# Patient Record
Sex: Male | Born: 1955 | Race: Black or African American | Hispanic: No | Marital: Married | Smoking: Former smoker
Health system: Southern US, Community
[De-identification: ages and names within clinical notes are randomized; demographics above are authoritative.]

## PROBLEM LIST (undated history)

## (undated) DIAGNOSIS — I10 Essential (primary) hypertension: Secondary | ICD-10-CM

## (undated) DIAGNOSIS — J45909 Unspecified asthma, uncomplicated: Secondary | ICD-10-CM

## (undated) DIAGNOSIS — M1990 Unspecified osteoarthritis, unspecified site: Secondary | ICD-10-CM

## (undated) DIAGNOSIS — E785 Hyperlipidemia, unspecified: Secondary | ICD-10-CM

## (undated) DIAGNOSIS — K219 Gastro-esophageal reflux disease without esophagitis: Secondary | ICD-10-CM

## (undated) HISTORY — PX: COLONOSCOPY: SHX174

---

## 2005-12-21 ENCOUNTER — Emergency Department (HOSPITAL_COMMUNITY): Admission: EM | Admit: 2005-12-21 | Discharge: 2005-12-21 | Payer: Self-pay | Admitting: Family Medicine

## 2006-11-12 ENCOUNTER — Emergency Department (HOSPITAL_COMMUNITY): Admission: EM | Admit: 2006-11-12 | Discharge: 2006-11-12 | Payer: Self-pay | Admitting: Emergency Medicine

## 2006-12-19 ENCOUNTER — Emergency Department (HOSPITAL_COMMUNITY): Admission: EM | Admit: 2006-12-19 | Discharge: 2006-12-20 | Payer: Self-pay | Admitting: Emergency Medicine

## 2008-08-24 ENCOUNTER — Emergency Department: Payer: Self-pay | Admitting: Emergency Medicine

## 2009-05-09 ENCOUNTER — Emergency Department (HOSPITAL_COMMUNITY): Admission: EM | Admit: 2009-05-09 | Discharge: 2009-05-09 | Payer: Self-pay | Admitting: Emergency Medicine

## 2009-08-18 ENCOUNTER — Emergency Department: Payer: Self-pay | Admitting: Emergency Medicine

## 2010-04-28 LAB — POCT URINALYSIS DIP (DEVICE)
Bilirubin Urine: NEGATIVE
Glucose, UA: NEGATIVE mg/dL
Hgb urine dipstick: NEGATIVE
Protein, ur: NEGATIVE mg/dL
Specific Gravity, Urine: 1.01 (ref 1.005–1.030)
Urobilinogen, UA: 0.2 mg/dL (ref 0.0–1.0)

## 2010-11-16 LAB — ETHANOL: Alcohol, Ethyl (B): 5

## 2010-11-18 LAB — I-STAT 8, (EC8 V) (CONVERTED LAB)
Acid-Base Excess: 3 — ABNORMAL HIGH
BUN: 14
Chloride: 105
Hemoglobin: 17.7 — ABNORMAL HIGH
Potassium: 4.4
Sodium: 142
TCO2: 32
pCO2, Ven: 54.5 — ABNORMAL HIGH

## 2010-11-18 LAB — POCT URINALYSIS DIP (DEVICE)
Bilirubin Urine: NEGATIVE
Nitrite: NEGATIVE
Protein, ur: NEGATIVE
Specific Gravity, Urine: 1.015

## 2010-11-18 LAB — POCT I-STAT CREATININE
Creatinine, Ser: 1.3
Operator id: 235561

## 2011-09-07 DIAGNOSIS — K219 Gastro-esophageal reflux disease without esophagitis: Secondary | ICD-10-CM | POA: Insufficient documentation

## 2014-12-29 ENCOUNTER — Emergency Department: Payer: No Typology Code available for payment source

## 2014-12-29 ENCOUNTER — Emergency Department
Admission: EM | Admit: 2014-12-29 | Discharge: 2014-12-29 | Disposition: A | Discharge status: Home / Self Care | Payer: No Typology Code available for payment source | Attending: Emergency Medicine | Admitting: Emergency Medicine

## 2014-12-29 DIAGNOSIS — Y9389 Activity, other specified: Secondary | ICD-10-CM | POA: Insufficient documentation

## 2014-12-29 DIAGNOSIS — S161XXA Strain of muscle, fascia and tendon at neck level, initial encounter: Secondary | ICD-10-CM | POA: Diagnosis not present

## 2014-12-29 DIAGNOSIS — Y998 Other external cause status: Secondary | ICD-10-CM | POA: Diagnosis not present

## 2014-12-29 DIAGNOSIS — S199XXA Unspecified injury of neck, initial encounter: Secondary | ICD-10-CM | POA: Diagnosis present

## 2014-12-29 DIAGNOSIS — Y92410 Unspecified street and highway as the place of occurrence of the external cause: Secondary | ICD-10-CM | POA: Insufficient documentation

## 2014-12-29 DIAGNOSIS — S39012A Strain of muscle, fascia and tendon of lower back, initial encounter: Secondary | ICD-10-CM | POA: Diagnosis not present

## 2014-12-29 MED ORDER — IBUPROFEN 800 MG PO TABS: 800.0000 mg | ORAL_TABLET | Freq: Three times a day (TID) | ORAL | Status: DC | PRN

## 2014-12-29 MED ORDER — CYCLOBENZAPRINE HCL 10 MG PO TABS: 10.0000 mg | ORAL_TABLET | Freq: Three times a day (TID) | ORAL | Status: DC | PRN
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
## 2014-12-29 NOTE — Discharge Instructions (Signed)
Cervical Sprain °A cervical sprain is an injury in the neck in which the strong, fibrous tissues (ligaments) that connect your neck bones stretch or tear. Cervical sprains can range from mild to severe. Severe cervical sprains can cause the neck vertebrae to be unstable. This can lead to damage of the spinal cord and can result in serious nervous system problems. The amount of time it takes for a cervical sprain to get better depends on the cause and extent of the injury. Most cervical sprains heal in 1 to 3 weeks. °CAUSES  °Severe cervical sprains may be caused by:  °· Contact sport injuries (such as from football, rugby, wrestling, hockey, auto racing, gymnastics, diving, martial arts, or boxing).   °· Motor vehicle collisions.   °· Whiplash injuries. This is an injury from a sudden forward and backward whipping movement of the head and neck.  °· Falls.   °Mild cervical sprains may be caused by:  °· Being in an awkward position, such as while cradling a telephone between your ear and shoulder.   °· Sitting in a chair that does not offer proper support.   °· Working at a poorly designed computer station.   °· Looking up or down for long periods of time.   °SYMPTOMS  °· Pain, soreness, stiffness, or a burning sensation in the front, back, or sides of the neck. This discomfort may develop immediately after the injury or slowly, 24 hours or more after the injury.   °· Pain or tenderness directly in the middle of the back of the neck.   °· Shoulder or upper back pain.   °· Limited ability to move the neck.   °· Headache.   °· Dizziness.   °· Weakness, numbness, or tingling in the hands or arms.   °· Muscle spasms.   °· Difficulty swallowing or chewing.   °· Tenderness and swelling of the neck.   °DIAGNOSIS  °Most of the time your health care provider can diagnose a cervical sprain by taking your history and doing a physical exam. Your health care provider will ask about previous neck injuries and any known neck  problems, such as arthritis in the neck. X-rays may be taken to find out if there are any other problems, such as with the bones of the neck. Other tests, such as a CT scan or MRI, may also be needed.  °TREATMENT  °Treatment depends on the severity of the cervical sprain. Mild sprains can be treated with rest, keeping the neck in place (immobilization), and pain medicines. Severe cervical sprains are immediately immobilized. Further treatment is done to help with pain, muscle spasms, and other symptoms and may include: °· Medicines, such as pain relievers, numbing medicines, or muscle relaxants.   °· Physical therapy. This may involve stretching exercises, strengthening exercises, and posture training. Exercises and improved posture can help stabilize the neck, strengthen muscles, and help stop symptoms from returning.   °HOME CARE INSTRUCTIONS  °· Put ice on the injured area.   °· Put ice in a plastic bag.   °· Place a towel between your skin and the bag.   °· Leave the ice on for 15-20 minutes, 3-4 times a day.   °· If your injury was severe, you may have been given a cervical collar to wear. A cervical collar is a two-piece collar designed to keep your neck from moving while it heals. °· Do not remove the collar unless instructed by your health care provider. °· If you have long hair, keep it outside of the collar. °· Ask your health care provider before making any adjustments to your collar. Minor   adjustments may be required over time to improve comfort and reduce pressure on your chin or on the back of your head. °· If you are allowed to remove the collar for cleaning or bathing, follow your health care provider's instructions on how to do so safely. °· Keep your collar clean by wiping it with mild soap and water and drying it completely. If the collar you have been given includes removable pads, remove them every 1-2 days and hand wash them with soap and water. Allow them to air dry. They should be completely  dry before you wear them in the collar. °· If you are allowed to remove the collar for cleaning and bathing, wash and dry the skin of your neck. Check your skin for irritation or sores. If you see any, tell your health care provider. °· Do not drive while wearing the collar.   °· Only take over-the-counter or prescription medicines for pain, discomfort, or fever as directed by your health care provider.   °· Keep all follow-up appointments as directed by your health care provider.   °· Keep all physical therapy appointments as directed by your health care provider.   °· Make any needed adjustments to your workstation to promote good posture.   °· Avoid positions and activities that make your symptoms worse.   °· Warm up and stretch before being active to help prevent problems.   °SEEK MEDICAL CARE IF:  °· Your pain is not controlled with medicine.   °· You are unable to decrease your pain medicine over time as planned.   °· Your activity level is not improving as expected.   °SEEK IMMEDIATE MEDICAL CARE IF:  °· You develop any bleeding. °· You develop stomach upset. °· You have signs of an allergic reaction to your medicine.   °· Your symptoms get worse.   °· You develop new, unexplained symptoms.   °· You have numbness, tingling, weakness, or paralysis in any part of your body.   °MAKE SURE YOU:  °· Understand these instructions. °· Will watch your condition. °· Will get help right away if you are not doing well or get worse. °  °This information is not intended to replace advice given to you by your health care provider. Make sure you discuss any questions you have with your health care provider. °  °Document Released: 11/21/2006 Document Revised: 01/29/2013 Document Reviewed: 08/01/2012 °Elsevier Interactive Patient Education ©2016 [employer] ° °Lumbosacral Strain °Lumbosacral strain is a strain of any of the parts that make up your lumbosacral vertebrae. Your lumbosacral vertebrae are the bones that make up  the lower third of your backbone. Your lumbosacral vertebrae are held together by muscles and tough, fibrous tissue (ligaments).  °CAUSES  °A sudden blow to your back can cause lumbosacral strain. Also, anything that causes an excessive stretch of the muscles in the low back can cause this strain. This is typically seen when people exert themselves strenuously, fall, lift heavy objects, bend, or crouch repeatedly. °RISK FACTORS °· Physically demanding work. °· Participation in pushing or pulling sports or sports that require a sudden twist of the back (tennis, golf, baseball). °· Weight lifting. °· Excessive lower back curvature. °· Forward-tilted pelvis. °· Weak back or abdominal muscles or both. °· Tight hamstrings. °SIGNS AND SYMPTOMS  °Lumbosacral strain may cause pain in the area of your injury or pain that moves (radiates) down your leg.  °DIAGNOSIS °Your health care provider can often diagnose lumbosacral strain through a physical exam. In some cases, you may need tests such as X-ray exams.  °TREATMENT  °  Treatment for your lower back injury depends on many factors that your clinician will have to evaluate. However, most treatment will include the use of anti-inflammatory medicines. °HOME CARE INSTRUCTIONS  °· Avoid hard physical activities (tennis, racquetball, waterskiing) if you are not in proper physical condition for it. This may aggravate or create problems. °· If you have a back problem, avoid sports requiring sudden body movements. Swimming and walking are generally safer activities. °· Maintain good posture. °· Maintain a healthy weight. °· For acute conditions, you may put ice on the injured area. °· Put ice in a plastic bag. °· Place a towel between your skin and the bag. °· Leave the ice on for 20 minutes, 2-3 times a day. °· When the low back starts healing, stretching and strengthening exercises may be recommended. °SEEK MEDICAL CARE IF: °· Your back pain is getting worse. °· You experience  severe back pain not relieved with medicines. °SEEK IMMEDIATE MEDICAL CARE IF:  °· You have numbness, tingling, weakness, or problems with the use of your arms or legs. °· There is a change in bowel or bladder control. °· You have increasing pain in any area of the body, including your belly (abdomen). °· You notice shortness of breath, dizziness, or feel faint. °· You feel sick to your stomach (nauseous), are throwing up (vomiting), or become sweaty. °· You notice discoloration of your toes or legs, or your feet get very cold. °MAKE SURE YOU:  °· Understand these instructions. °· Will watch your condition. °· Will get help right away if you are not doing well or get worse. °  °This information is not intended to replace advice given to you by your health care provider. Make sure you discuss any questions you have with your health care provider. °  °Document Released: 11/03/2004 Document Revised: 02/14/2014 Document Reviewed: 09/12/2012 °Elsevier Interactive Patient Education ©2016 [employer] Inc[employer] ° °Motor Vehicle Collision °It is common to have multiple bruises and sore muscles after a motor vehicle collision (MVC). These tend to feel worse for the first 24 hours. You may have the most stiffness and soreness over the first several hours. You may also feel worse when you wake up the first morning after your collision. After this point, you will usually begin to improve with each day. The speed of improvement often depends on the severity of the collision, the number of injuries, and the location and nature of these injuries. °HOME CARE INSTRUCTIONS °· Put ice on the injured area. °¨ Put ice in a plastic bag. °¨ Place a towel between your skin and the bag. °¨ Leave the ice on for 15-20 minutes, 3-4 times a day, or as directed by your health care provider. °· Drink enough fluids to keep your urine clear or pale yellow. Do not drink alcohol. °· Take a warm shower or bath once or twice a day. This will increase blood flow  to sore muscles. °· You may return to activities as directed by your caregiver. Be careful when lifting, as this may aggravate neck or back pain. °· Only take over-the-counter or prescription medicines for pain, discomfort, or fever as directed by your caregiver. Do not use aspirin. This may increase bruising and bleeding. °SEEK IMMEDIATE MEDICAL CARE IF: °· You have numbness, tingling, or weakness in the arms or legs. °· You develop severe headaches not relieved with medicine. °· You have severe neck pain, especially tenderness in the middle of the back of your neck. °· You have changes   in bowel or bladder control. °· There is increasing pain in any area of the body. °· You have shortness of breath, light-headedness, dizziness, or fainting. °· You have chest pain. °· You feel sick to your stomach (nauseous), throw up (vomit), or sweat. °· You have increasing abdominal discomfort. °· There is blood in your urine, stool, or vomit. °· You have pain in your shoulder (shoulder strap areas). °· You feel your symptoms are getting worse. °MAKE SURE YOU: °· Understand these instructions. °· Will watch your condition. °· Will get help right away if you are not doing well or get worse. °  °This information is not intended to replace advice given to you by your health care provider. Make sure you discuss any questions you have with your health care provider. °  °Document Released: 01/24/2005 Document Revised: 02/14/2014 Document Reviewed: 06/23/2010 °Elsevier Interactive Patient Education ©2016 [employer]vier Inc. ° °
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
## 2016-02-18 ENCOUNTER — Emergency Department: Payer: BLUE CROSS/BLUE SHIELD

## 2016-02-18 ENCOUNTER — Emergency Department
Admission: EM | Admit: 2016-02-18 | Discharge: 2016-02-18 | Disposition: A | Discharge status: Home / Self Care | Payer: BLUE CROSS/BLUE SHIELD | Attending: Emergency Medicine | Admitting: Emergency Medicine

## 2016-02-18 ENCOUNTER — Encounter: Payer: Self-pay | Admitting: Emergency Medicine

## 2016-02-18 DIAGNOSIS — F17200 Nicotine dependence, unspecified, uncomplicated: Secondary | ICD-10-CM | POA: Diagnosis not present

## 2016-02-18 DIAGNOSIS — I10 Essential (primary) hypertension: Secondary | ICD-10-CM | POA: Insufficient documentation

## 2016-02-18 DIAGNOSIS — M79642 Pain in left hand: Secondary | ICD-10-CM | POA: Insufficient documentation

## 2016-02-18 HISTORY — DX: Essential (primary) hypertension: I10

## 2016-02-18 MED ORDER — CELECOXIB 100 MG PO CAPS: 100.0000 mg | ORAL_CAPSULE | Freq: Every day | ORAL | 0 refills | Status: AC

## 2016-02-18 MED ORDER — CELECOXIB 100 MG PO CAPS: 100.0000 mg | ORAL_CAPSULE | Freq: Every day | ORAL | 2 refills | Status: DC

## 2016-02-18 MED ORDER — DICLOFENAC SODIUM 1 % TD CREA: 1.0000 g | TOPICAL_CREAM | Freq: Two times a day (BID) | TRANSDERMAL | 0 refills | Status: DC

## 2016-02-18 NOTE — ED Triage Notes (Signed)
States he developed left hand/wrist pin since Oct   W/o injury  Tender to touch  No swelling

## 2016-02-18 NOTE — ED Provider Notes (Signed)
[facility] Emergency Department Provider Note  ____________________________________________  Time seen: Approximately 10:35 AM  I have reviewed the triage vital signs and the nursing notes.   HISTORY  Chief Complaint Hand Pain    HPI Brian Forbes is a 61 y.o. male presents to the emergency department with left hand pain since October. Patient denies any injury to hand. Patient states that he has seen his PCP about hand and was told that he has arthritis. Patient states that it is difficult for him to make a fist. 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 Patient states that sometimes his hands will tingle in the night. Patient has taken meloxicam for pain, which has not helped.   Past Medical History:  Diagnosis Date  . Hypertension     There are no active problems to display for this patient.   History reviewed. No pertinent surgical history.  Prior to Admission medications   Medication Sig Start Date End Date Taking? Authorizing Provider  amLODipine-benazepril (LOTREL) 5-10 MG capsule Take 1 capsule by mouth daily.   Yes Historical Provider, MD  omeprazole (PRILOSEC) 40 MG capsule Take 40 mg by mouth daily.   Yes Historical Provider, MD  celecoxib (CELEBREX) 100 MG capsule Take 1 capsule (100 mg total) by mouth daily. 02/18/16 02/17/17  Enid DerryAshley Loriene Taunton, PA-C  Diclofenac Sodium 1 % CREA Place 1 g onto the skin 2 (two) times daily. 02/18/16   Enid DerryAshley Koula Venier, PA-C    Allergies Patient has no known allergies.  No family history on file.  Social History Social History  Substance Use Topics  . Smoking status: Current Every Day Smoker  . Smokeless tobacco: Never Used  . Alcohol use No     Review of Systems  Constitutional: No fever/chills ENT: No upper respiratory complaints. Cardiovascular: No chest pain. Respiratory: No SOB. Gastrointestinal: No abdominal pain.  No nausea, no vomiting.   Skin: Negative for rash, abrasions, lacerations, ecchymosis.    ____________________________________________   PHYSICAL EXAM:  VITAL SIGNS: ED Triage Vitals  Enc Vitals Group     BP 02/18/16 0927 (!) 158/82     Pulse Rate 02/18/16 0927 81     Resp 02/18/16 0927 18     Temp 02/18/16 0927 98.3 F (36.8 C)     Temp Source 02/18/16 0927 Oral     SpO2 02/18/16 0927 98 %     Weight 02/18/16 0928 169 lb (76.7 kg)     Height 02/18/16 0928 5\' 4"  (1.626 m)     Head Circumference --      Peak Flow --      Pain Score 02/18/16 0942 8     Pain Loc --      Pain Edu? --      Excl. in GC? --      Constitutional: Alert and oriented. Well appearing and in no acute distress. Eyes: Conjunctivae are normal. PERRL. EOMI. Head: Atraumatic. ENT:      Ears:      Nose: No congestion/rhinnorhea.      Mouth/Throat: Mucous membranes are moist.  Neck: No stridor.   Cardiovascular: Normal rate, regular rhythm. Normal S1 and S2.  Good peripheral circulation. 2+ radial pulses. Respiratory: Normal respiratory effort without tachypnea or retractions. Lungs CTAB. Good air entry to the bases with no decreased or absent breath sounds. Musculoskeletal: Full range of motion to all extremities. No gross deformities appreciated. Tenderness to palpation along the ulnar  aspect of hand. Positive Phelan's. Negative Tinel's and Finkelstein's. Neurologic:  Normal speech and language. No gross focal neurologic deficits are appreciated. Sensation to fingertips intact. Skin:  Skin is warm, dry and intact. No rash noted. Psychiatric: Mood and affect are normal. Speech and behavior are normal. Patient exhibits appropriate insight and judgement.   ____________________________________________   LABS (all labs ordered are listed, but only abnormal results are displayed)  Labs Reviewed - No data to  display ____________________________________________  EKG   ____________________________________________  RADIOLOGY Lexine Baton, personally viewed and evaluated these images (plain radiographs) as part of my medical decision making, as well as reviewing the written report by the radiologist.  Dg Wrist Complete Left  Result Date: 02/18/2016 CLINICAL DATA:  Medial left wrist pain for 3 months, no known injury EXAM: LEFT WRIST - COMPLETE 3+ VIEW COMPARISON:  None. FINDINGS: Four views of the left wrist submitted. No acute fracture or subluxation. Mild narrowing of radiocarpal joint space. No radiopaque foreign body. IMPRESSION: No acute fracture or subluxation. Mild narrowing of radiocarpal joint space. Electronically Signed   By: Natasha Mead M.D.   On: 02/18/2016 10:44    ____________________________________________    PROCEDURES  Procedure(s) performed:    Procedures    Medications - No data to display   ____________________________________________   INITIAL IMPRESSION / ASSESSMENT AND PLAN / ED COURSE  Pertinent labs & imaging results that were available during my care of the patient were reviewed by me and considered in my medical decision making (see chart for details).  Review of the [place] CSRS was performed in accordance of the [facility] prior to dispensing any controlled drugs.  Clinical Course     Patient presents to the emergency department with left hand pain. Exam and vital signs are reassuring. Patient has a positive Phelan's test. Patient likely has some carpal tunnel syndrome. X-ray negative for any acute bony abnormalities. Patient will be discharged home with prescriptions for Diclofenac and Celebrex. Patient is to follow up with PCP as directed. Patient is given ED precautions to return to the ED for any worsening or new symptoms.  ____________________________________________  FINAL CLINICAL IMPRESSION(S) / ED DIAGNOSES  Final diagnoses:  Left hand pain       NEW MEDICATIONS STARTED DURING THIS VISIT:  Discharge Medication List as of 02/18/2016 11:10 AM    START taking these medications   Details  celecoxib (CELEBREX) 100 MG capsule Take 1 capsule (100 mg total) by mouth daily., Starting Thu 02/18/2016, Until Fri 02/17/2017, Print    Diclofenac Sodium 1 % CREA Place 1 g onto the skin 2 (two) times daily., Starting Thu 02/18/2016, Print            This chart was dictated using voice recognition software/Dragon. Despite best efforts to proofread, errors can occur which can change the meaning. Any change was purely unintentional.    Enid Derry, PA-C 02/18/16 1324    Jennye Moccasin, MD 02/18/16 [id number]

## 2016-08-28 ENCOUNTER — Encounter: Payer: Self-pay | Admitting: Emergency Medicine

## 2016-08-28 ENCOUNTER — Emergency Department: Payer: Self-pay

## 2016-08-28 ENCOUNTER — Emergency Department
Admission: EM | Admit: 2016-08-28 | Discharge: 2016-08-28 | Disposition: A | Discharge status: Home / Self Care | Payer: Self-pay | Attending: Emergency Medicine | Admitting: Emergency Medicine

## 2016-08-28 DIAGNOSIS — F17200 Nicotine dependence, unspecified, uncomplicated: Secondary | ICD-10-CM | POA: Insufficient documentation

## 2016-08-28 DIAGNOSIS — I10 Essential (primary) hypertension: Secondary | ICD-10-CM | POA: Insufficient documentation

## 2016-08-28 DIAGNOSIS — M7521 Bicipital tendinitis, right shoulder: Secondary | ICD-10-CM | POA: Insufficient documentation

## 2016-08-28 DIAGNOSIS — Z79899 Other long term (current) drug therapy: Secondary | ICD-10-CM | POA: Insufficient documentation

## 2016-08-28 DIAGNOSIS — M7531 Calcific tendinitis of right shoulder: Secondary | ICD-10-CM | POA: Insufficient documentation

## 2016-08-28 HISTORY — DX: Gastro-esophageal reflux disease without esophagitis: K21.9

## 2016-08-28 MED ORDER — KETOROLAC TROMETHAMINE 60 MG/2ML IM SOLN
30.0000 mg | Freq: Once | INTRAMUSCULAR | Status: AC
  Administered 2016-08-28: 30 mg via INTRAMUSCULAR
  Filled 2016-08-28: qty 2

## 2016-08-28 MED ORDER — DICLOFENAC SODIUM 75 MG PO TBEC: 75.0000 mg | DELAYED_RELEASE_TABLET | Freq: Two times a day (BID) | ORAL | 0 refills | Status: AC

## 2016-08-28 MED ORDER — ORPHENADRINE CITRATE 30 MG/ML IJ SOLN
60.0000 mg | INTRAMUSCULAR | Status: AC
  Administered 2016-08-28: 60 mg via INTRAMUSCULAR
  Filled 2016-08-28: qty 2

## 2016-08-28 MED ORDER — CYCLOBENZAPRINE HCL 5 MG PO TABS: 5.0000 mg | ORAL_TABLET | Freq: Three times a day (TID) | ORAL | 0 refills | Status: DC | PRN

## 2016-08-28 NOTE — ED Provider Notes (Signed)
[facility] Emergency Department Provider Note ____________________________________________  Time seen: [id number]  I have reviewed the triage vital signs and the nursing notes.  HISTORY  Chief Complaint  Shoulder Pain  HPI Brian Forbes is a 61 y.o. male presents to the ED for evaluation of right shoulder pain. Patient describes her Friday pulling the cord for his weedeater, several times while doing yard work. He thinks he may have pulled muscle to the right shoulder. He denies any distal paresthesias, grip changes, or fever.  Past Medical History:  Diagnosis Date  . GERD (gastroesophageal reflux disease)   . Hypertension     There are no active problems to display for this patient.  History reviewed. No pertinent surgical history.  Prior to Admission medications   Medication Sig Start Date End Date Taking? Authorizing Provider  amLODipine-benazepril (LOTREL) 5-10 MG capsule Take 1 capsule by mouth daily.    [provider]  celecoxib (CELEBREX) 100 MG capsule Take 1 capsule (100 mg total) by mouth daily. 02/18/16 02/17/17  Enid DerryWagner, Ashley, PA-C  cyclobenzaprine (FLEXERIL) 5 MG tablet Take 1 tablet (5 mg total) by mouth 3 (three) times daily as needed for muscle spasms (dose at bedtime or up to 3 times daily). 08/28/16   Sohan Potvin, Charlesetta IvoryJenise V Bacon, PA-C  diclofenac (VOLTAREN) 75 MG EC tablet Take 1 tablet (75 mg total) by mouth 2 (two) times daily. 08/28/16 09/12/16  Karla Pavone, Charlesetta IvoryJenise V Bacon, PA-C  Diclofenac Sodium 1 % CREA Place 1 g onto the skin 2 (two) times daily. 02/18/16   Enid DerryWagner, Ashley, PA-C  omeprazole (PRILOSEC) 40 MG capsule Take 40 mg by mouth daily.    [provider]   Allergies Patient has no known allergies.  No family history on file.  Social History Social History  Substance Use Topics  . Smoking status: Current Every Day Smoker  . Smokeless tobacco: Never Used  . Alcohol use No    Review of Systems  Constitutional:  Negative for fever. Cardiovascular: Negative for chest pain. Respiratory: Negative for shortness of breath. Musculoskeletal: Negative for back pain. Right shoulder pain as above. Skin: Negative for rash. Neurological: Negative for headaches, focal weakness or numbness. ____________________________________________  PHYSICAL EXAM:  VITAL SIGNS: ED Triage Vitals [08/28/16 1736]  Enc Vitals Group     BP (!) 119/55     Pulse Rate 84     Resp 16     Temp 98.4 F (36.9 C)     Temp Source Oral     SpO2 98 %     Weight      Height      Head Circumference      Peak Flow      Pain Score 7     Pain Loc      Pain Edu?      Excl. in GC?     Constitutional: Alert and oriented. Well appearing and in no distress. Head: Normocephalic and atraumatic. Cardiovascular: Normal rate, regular rhythm. Normal distal pulses. Respiratory: Normal respiratory effort. No wheezes/rales/rhonchi. Musculoskeletal: Nontender with normal range of motion in all extremities.  Neurologic:  Normal gait without ataxia. Normal speech and language. No gross focal neurologic deficits are appreciated. Skin:  Skin is warm, dry and intact. No rash noted. ____________________________________________   RADIOLOGY  Right Shoulder FINDINGS: Mild AC joint degenerative change. No fracture or dislocation. Right lung apex is clear. Possible tendinous calcifications anteriorly on the axillary view.  IMPRESSION: No acute osseous abnormality  I, Yeira Gulden  Marcelyn Bruins, personally viewed and evaluated these images (plain radiographs) as part of my medical decision making, as well as reviewing the written report by the radiologist. ____________________________________________  PROCEDURES  Toradol 30 mg IM Norflex 60 mg IM Arm sling ____________________________________________  INITIAL IMPRESSION / ASSESSMENT AND PLAN / ED COURSE  Patient presents to the ED for increasing right shoulder pain and disability. The  symptoms began after mechanical injury, pulling a report on the weedeater repeatedly. Patient's exam is consistent with a likely biceps tendinitis and rotator cuff tendinitis. His x-ray reveals no acute osseous abnormality, but does note probable calcific tendinosis. Patient will be discharged with instructions to follow-up with orthopedics for definitive management. He will be placed in an arm sling for comfort, and provided with work restrictions for the week limiting the use of his right shoulder and arm. He'll be provided with prescriptions for Flexeril and diclofenac to dose as directed. ____________________________________________  FINAL CLINICAL IMPRESSION(S) / ED DIAGNOSES  Final diagnoses:  Calcific tendinitis of right shoulder  Biceps tendinitis of right upper extremity      Karmen Stabs, Charlesetta Ivory, PA-C 08/28/16 1847    Myrna Blazer, MD 08/28/16 2337

## 2016-08-28 NOTE — ED Triage Notes (Signed)
Pt sates that he was pulling the weed eater cord on Friday and thinks that he may have pulled a muscle in his right shoulder.

## 2016-08-28 NOTE — Discharge Instructions (Signed)
You have some tendinitis of the right shoulder. There is also some calcification of the biceps tendon. This causes pain and difficulty with range of motion of the shoulder. You should take the pain medicine as directed and the muscle relaxant as needed. Follow-up with Dr. Martha ClanKrasinski for definitive treatment. Monitor your body mechanics and wear the sling for support.

## 2017-12-29 DIAGNOSIS — J4530 Mild persistent asthma, uncomplicated: Secondary | ICD-10-CM | POA: Insufficient documentation

## 2019-03-25 DIAGNOSIS — E785 Hyperlipidemia, unspecified: Secondary | ICD-10-CM | POA: Insufficient documentation

## 2019-11-25 ENCOUNTER — Other Ambulatory Visit
Admission: RE | Admit: 2019-11-25 | Discharge: 2019-11-25 | Disposition: A | Discharge status: Home / Self Care | Payer: Medicare HMO | Source: Ambulatory Visit | Attending: Family Medicine | Admitting: Family Medicine

## 2019-11-25 DIAGNOSIS — R0789 Other chest pain: Secondary | ICD-10-CM | POA: Insufficient documentation

## 2019-11-25 DIAGNOSIS — R0600 Dyspnea, unspecified: Secondary | ICD-10-CM | POA: Diagnosis present

## 2019-11-25 LAB — FIBRIN DERIVATIVES D-DIMER (ARMC ONLY): Fibrin derivatives D-dimer (ARMC): 744.84 ng/mL (FEU) — ABNORMAL HIGH (ref 0.00–499.00)

## 2020-05-17 ENCOUNTER — Encounter: Payer: Self-pay | Admitting: Emergency Medicine

## 2020-05-17 ENCOUNTER — Emergency Department: Payer: Medicare Other

## 2020-05-17 ENCOUNTER — Other Ambulatory Visit: Payer: Self-pay

## 2020-05-17 ENCOUNTER — Emergency Department
Admission: EM | Admit: 2020-05-17 | Discharge: 2020-05-17 | Disposition: A | Discharge status: Home / Self Care | Payer: Medicare Other | Attending: Emergency Medicine | Admitting: Emergency Medicine

## 2020-05-17 DIAGNOSIS — M25511 Pain in right shoulder: Secondary | ICD-10-CM | POA: Diagnosis not present

## 2020-05-17 DIAGNOSIS — I10 Essential (primary) hypertension: Secondary | ICD-10-CM | POA: Insufficient documentation

## 2020-05-17 DIAGNOSIS — Z79899 Other long term (current) drug therapy: Secondary | ICD-10-CM | POA: Insufficient documentation

## 2020-05-17 DIAGNOSIS — F17200 Nicotine dependence, unspecified, uncomplicated: Secondary | ICD-10-CM | POA: Insufficient documentation

## 2020-05-17 MED ORDER — PREDNISONE 10 MG PO TABS: ORAL_TABLET | ORAL | 0 refills | Status: DC

## 2020-05-17 NOTE — Discharge Instructions (Addendum)
Follow-up with [facility] orthopedic department if any continued problems with your shoulder.  Begin taking prednisone that was sent to your pharmacy as directed starting with 6 tablets and tapering down to 1 tablet over the next 6 days.  You may use ice or heat to your shoulder as needed for discomfort.

## 2020-05-17 NOTE — ED Provider Notes (Signed)
[facility] Emergency Department Provider Note   ____________________________________________   Event Date/Time   First MD Initiated Contact with Patient 05/17/20 0802     (approximate)  I have reviewed the triage vital signs and the nursing notes.   HISTORY  Chief Complaint Shoulder Pain   HPI Brian Forbes is a 65 y.o. male presents to the ED with complaint of right shoulder pain for 1 month.  Patient states that he thinks he pulled a muscle in his shoulder because the pain gets worse with movement.  Patient currently drives a transit bus.  He denies any direct trauma to his shoulder.  He has been taking Tylenol and BCs to help control his pain.  Patient states that in the past he has been to [facility] orthopedic department and had a steroid shot.  He rates his pain as a 10/10.     Past Medical History:  Diagnosis Date  . GERD (gastroesophageal reflux disease)   . Hypertension     There are no problems to display for this patient.   History reviewed. No pertinent surgical history.  Prior to Admission medications   Medication Sig Start Date End Date Taking? Authorizing Provider  predniSONE (DELTASONE) 10 MG tablet Take 6 tablets  today, on day 2 take 5 tablets, day 3 take 4 tablets, day 4 take 3 tablets, day 5 take  2 tablets and 1 tablet the last day 05/17/20  Yes Levada Schilling, Veryl Abril L, PA-C  amLODipine-benazepril (LOTREL) 5-10 MG capsule Take 1 capsule by mouth daily.    [provider]  cyclobenzaprine (FLEXERIL) 5 MG tablet Take 1 tablet (5 mg total) by mouth 3 (three) times daily as needed for muscle spasms (dose at bedtime or up to 3 times daily). 08/28/16   Menshew, Charlesetta Ivory, PA-C  Diclofenac Sodium 1 % CREA Place 1 g onto the skin 2 (two) times daily. 02/18/16   Enid Derry, PA-C  omeprazole (PRILOSEC) 40 MG capsule Take 40 mg by mouth daily.    [provider]    Allergies Patient has no known  allergies.  No family history on file.  Social History Social History   Tobacco Use  . Smoking status: Current Every Day Smoker  . Smokeless tobacco: Never Used  Substance Use Topics  . Alcohol use: No    Review of Systems Constitutional: No fever/chills Eyes: No visual changes. Cardiovascular: Denies chest pain. Respiratory: Denies shortness of breath. Gastrointestinal: No abdominal pain.  No nausea, no vomiting.  Musculoskeletal: Positive right shoulder pain. Skin: Negative for rash. Neurological: Negative for headaches, focal weakness or numbness. ____________________________________________   PHYSICAL EXAM:  VITAL SIGNS: ED Triage Vitals  Enc Vitals Group     BP 05/17/20 0801 128/81     Pulse Rate 05/17/20 0801 93     Resp 05/17/20 0801 16     Temp 05/17/20 0801 97.9 F (36.6 C)     Temp Source 05/17/20 0801 Oral     SpO2 05/17/20 0801 97 %     Weight 05/17/20 0758 170 lb (77.1 kg)     Height 05/17/20 0758 5\' 5"  (1.651 m)     Head Circumference --      Peak Flow --      Pain Score 05/17/20 0757 10     Pain Loc --      Pain Edu? --      Excl. in GC? --     Constitutional: Alert and oriented. Well appearing  and in no acute distress. Eyes: Conjunctivae are normal.  Head: Atraumatic. Neck: No stridor.   Cardiovascular: Normal rate, regular rhythm. Grossly normal heart sounds.  Good peripheral circulation. Respiratory: Normal respiratory effort.  No retractions. Lungs CTAB. Musculoskeletal: On examination of the right shoulder there is no gross deformity, edema or erythema.  Patient is able to abduct and abduct and is able to get approximately 45 degrees without difficulty.  There is no crepitus with range of motion appreciated.  Muscle strength bilaterally 5/5.  Sensory function intact.  Capillary refills less than 3 seconds.  No rash or discoloration present. Neurologic:  Normal speech and language. No gross focal neurologic deficits are appreciated.  Skin:   Skin is warm, dry and intact.  Psychiatric: Mood and affect are normal. Speech and behavior are normal.  ____________________________________________   LABS (all labs ordered are listed, but only abnormal results are displayed)  Labs Reviewed - No data to display ____________________________________________  RADIOLOGY I, Tommi Rumps, personally viewed and evaluated these images (plain radiographs) as part of my medical decision making, as well as reviewing the written report by the radiologist.   Official radiology report(s): DG Shoulder Right  Result Date: 05/17/2020 CLINICAL DATA:  1 month history of right shoulder pain. EXAM: RIGHT SHOULDER - 2+ VIEW COMPARISON:  08/28/2016 FINDINGS: No evidence for an acute fracture. No shoulder separation or dislocation. Mild degenerative changes at the [facility] joint. IMPRESSION: No acute bony finding. Electronically Signed   By: Brian Forbes M.D.   On: 05/17/2020 09:09    ____________________________________________   PROCEDURES  Procedure(s) performed (including Critical Care):  Procedures   ____________________________________________   INITIAL IMPRESSION / ASSESSMENT AND PLAN / ED COURSE  As part of my medical decision making, I reviewed the following data within the electronic MEDICAL RECORD NUMBER Notes from prior ED visits and [place] Controlled Substance Database  65 year old male presents to the ED with complaint of right shoulder pain for 1 month.  Patient denies any known injury and continues to drive a transit bus.  He states that there are certain positions that makes his shoulder hurt worse.  He has been taking Tylenol and BCs without any relief.  He has in the past been to the orthopedic department at [facility] and gotten a cortisone injection in his shoulder.  X-rays of the right shoulder show some mild degenerative changes at the [facility] joint.  Patient is encouraged to call and make an appointment at the orthopedic department  again if medications today are not helping.  He was discharged with a prescription for a tapered dose of prednisone starting with 60 mg and tapering over the next 6 days.  He may use ice or heat to his shoulder as needed for discomfort.  He also is aware that he can take Tylenol with this medication.  ____________________________________________   FINAL CLINICAL IMPRESSION(S) / ED DIAGNOSES  Final diagnoses:  Acute pain of right shoulder     ED Discharge Orders         Ordered    predniSONE (DELTASONE) 10 MG tablet        05/17/20 2263          *Please note:  SEABORN NAKAMA was evaluated in Emergency Department on 05/17/2020 for the symptoms described in the history of present illness. He was evaluated in the context of the global COVID-19 pandemic, which necessitated consideration that the patient might be at risk for infection with the SARS-CoV-2 virus that causes COVID-19.  Institutional protocols and algorithms that pertain to the evaluation of patients at risk for COVID-19 are in a state of rapid change based on information released by regulatory bodies including the CDC and federal and state organizations. These policies and algorithms were followed during the patient's care in the ED.  Some ED evaluations and interventions may be delayed as a result of limited staffing during and the pandemic.*   Note:  This document was prepared using Dragon voice recognition software and may include unintentional dictation errors.    Tommi Rumps, PA-C 05/17/20 0350    Dionne Bucy, MD 05/17/20 [phone]

## 2020-05-17 NOTE — ED Triage Notes (Signed)
Pt reports pain to his right shoulder for the last month. Pt states thinks he pulled a muscle in his shoulder because the pain gets worse when he tries to raise his right arm.

## 2020-07-15 ENCOUNTER — Other Ambulatory Visit: Payer: Self-pay | Admitting: Student

## 2020-07-15 ENCOUNTER — Other Ambulatory Visit (HOSPITAL_COMMUNITY): Payer: Self-pay | Admitting: Student

## 2020-07-15 DIAGNOSIS — M7581 Other shoulder lesions, right shoulder: Secondary | ICD-10-CM

## 2020-07-30 ENCOUNTER — Ambulatory Visit: Payer: Medicare Other

## 2020-08-08 ENCOUNTER — Other Ambulatory Visit: Payer: Self-pay

## 2020-08-08 ENCOUNTER — Ambulatory Visit
Admission: RE | Admit: 2020-08-08 | Discharge: 2020-08-08 | Disposition: A | Discharge status: Home / Self Care | Payer: Medicare Other | Source: Ambulatory Visit | Attending: Student | Admitting: Student

## 2020-08-08 DIAGNOSIS — M7581 Other shoulder lesions, right shoulder: Secondary | ICD-10-CM | POA: Insufficient documentation

## 2020-10-28 ENCOUNTER — Other Ambulatory Visit: Payer: Self-pay | Admitting: Surgery

## 2020-11-02 ENCOUNTER — Other Ambulatory Visit: Payer: Self-pay

## 2020-11-02 ENCOUNTER — Encounter
Admission: RE | Admit: 2020-11-02 | Discharge: 2020-11-02 | Disposition: A | Discharge status: Home / Self Care | Payer: Medicare Other | Source: Ambulatory Visit | Attending: Surgery | Admitting: Surgery

## 2020-11-02 HISTORY — DX: Unspecified osteoarthritis, unspecified site: M19.90

## 2020-11-02 HISTORY — DX: Hyperlipidemia, unspecified: E78.5

## 2020-11-02 HISTORY — DX: Unspecified asthma, uncomplicated: J45.909

## 2020-11-02 NOTE — Patient Instructions (Signed)
Your procedure is scheduled on:11-10-20 Tuesday Report to the Registration Desk on the 1st floor of the Medical Mall.Then proceed to the 2nd floor Surgery Desk in the Medical Mall To find out your arrival time, please call [phone] between 1PM - 3PM on:11-09-20 Monday  REMEMBER: Instructions that are not followed completely may result in serious medical risk, up to and including death; or upon the discretion of your surgeon and anesthesiologist your surgery may need to be rescheduled.  Do not eat food after midnight the night before surgery.  No gum chewing, lozengers or hard candies.  You may however, drink CLEAR liquids up to 2 hours before you are scheduled to arrive for your surgery. Do not drink anything within 2 hours of your scheduled arrival time.  Clear liquids include: - water  - apple juice without pulp - gatorade (not RED, PURPLE, OR BLUE) - black coffee or tea (Do NOT add milk or creamers to the coffee or tea) Do NOT drink anything that is not on this list.  In addition, your doctor has ordered for you to drink the provided  Ensure Pre-Surgery Clear Carbohydrate Drink  Drinking this carbohydrate drink up to two hours before surgery helps to reduce insulin resistance and improve patient outcomes. Please complete drinking 2 hours prior to scheduled arrival time.  TAKE THESE MEDICATIONS THE MORNING OF SURGERY WITH A SIP OF WATER: -atorvastatin (LIPITOR) 10 MG tablet -omeprazole (PRILOSEC) 20 MG capsule-take one the night before and one on the morning of surgery - helps to prevent nausea after surgery.  Use your Albuterol Inhaler the day of surgery and bring your Albuterol inhaler to the Hospital  One week prior to surgery: Stop Anti-inflammatories (NSAIDS) such as Advil, Aleve, Ibuprofen, Motrin, Naproxen, Naprosyn and Aspirin based products such as Excedrin, Goodys Powder, BC Powder.You may however,take Tylenol if needed for pain up until the day of surgery.  Stop ANY  OVER THE COUNTER supplements/vitamins NOW (11-02-20) until after surgery.  No Alcohol for 24 hours before or after surgery.  No Smoking including e-cigarettes for 24 hours prior to surgery.  No chewable tobacco products for at least 6 hours prior to surgery.  No nicotine patches on the day of surgery.  Do not use any "recreational" drugs for at least a week prior to your surgery.  Please be advised that the combination of cocaine and anesthesia may have negative outcomes, up to and including death. If you test positive for cocaine, your surgery will be cancelled.  On the morning of surgery brush your teeth with toothpaste and water, you may rinse your mouth with mouthwash if you wish. Do not swallow any toothpaste or mouthwash.  Use CHG Soap as directed on instruction sheet.  Do not wear jewelry, make-up, hairpins, clips or nail polish.  Do not wear lotions, powders, or perfumes.   Do not shave body from the neck down 48 hours prior to surgery just in case you cut yourself which could leave a site for infection.  Also, freshly shaved skin may become irritated if using the CHG soap.  Contact lenses, hearing aids and dentures may not be worn into surgery.  Do not bring valuables to the hospital. [facility] is not responsible for any missing/lost belongings or valuables.   Notify your doctor if there is any change in your medical condition (cold, fever, infection).  Wear comfortable clothing (specific to your surgery type) to the hospital.  After surgery, you can help prevent lung complications by doing breathing  exercises.  Take deep breaths and cough every 1-2 hours. Your doctor may order a device called an Incentive Spirometer to help you take deep breaths. When coughing or sneezing, hold a pillow firmly against your incision with both hands. This is called "splinting." Doing this helps protect your incision. It also decreases belly discomfort.  If you are being admitted to the  hospital overnight, leave your suitcase in the car. After surgery it may be brought to your room.  If you are being discharged the day of surgery, you will not be allowed to drive home. You will need a responsible adult (18 years or older) to drive you home and stay with you that night.   If you are taking public transportation, you will need to have a responsible adult (18 years or older) with you. Please confirm with your physician that it is acceptable to use public transportation.   Please call the Pre-admissions Testing Dept. at [phone] if you have any questions about these instructions.  Surgery Visitation Policy:  Patients undergoing a surgery or procedure may have one family member or support person with them as long as that person is not COVID-19 positive or experiencing its symptoms.  That person may remain in the waiting area during the procedure and may rotate out with other people.  Inpatient Visitation:    Visiting hours are 7 a.m. to 8 p.m. Up to two visitors ages 16+ are allowed at one time in a patient room. The visitors may rotate out with other people during the day. Visitors must check out when they leave, or other visitors will not be allowed. One designated support person may remain overnight. The visitor must pass COVID-19 screenings, use hand sanitizer when entering and exiting the patient's room and wear a mask at all times, including in the patient's room. Patients must also wear a mask when staff or their visitor are in the room. Masking is required regardless of vaccination status.

## 2020-11-04 ENCOUNTER — Encounter
Admission: RE | Admit: 2020-11-04 | Discharge: 2020-11-04 | Disposition: A | Discharge status: Home / Self Care | Payer: Medicare Other | Source: Ambulatory Visit | Attending: Surgery | Admitting: Surgery

## 2020-11-04 ENCOUNTER — Other Ambulatory Visit: Payer: Self-pay

## 2020-11-04 DIAGNOSIS — Z01818 Encounter for other preprocedural examination: Secondary | ICD-10-CM | POA: Insufficient documentation

## 2020-11-04 DIAGNOSIS — Z01810 Encounter for preprocedural cardiovascular examination: Secondary | ICD-10-CM

## 2020-11-04 LAB — BASIC METABOLIC PANEL
Anion gap: 10 (ref 5–15)
BUN: 11 mg/dL (ref 8–23)
CO2: 26 mmol/L (ref 22–32)
Calcium: 9.3 mg/dL (ref 8.9–10.3)
Chloride: 104 mmol/L (ref 98–111)
Creatinine, Ser: 1.16 mg/dL (ref 0.61–1.24)
GFR, Estimated: >60 mL/min (ref >60)
Glucose, Bld: 109 mg/dL — ABNORMAL HIGH (ref 70–99)
Potassium: 3.3 mmol/L — ABNORMAL LOW (ref 3.5–5.1)
Sodium: 140 mmol/L (ref 135–145)

## 2020-11-10 ENCOUNTER — Encounter
Admission: RE | Disposition: A | Discharge status: Home / Self Care | Payer: Self-pay | Source: Home / Self Care | Attending: Surgery

## 2020-11-10 ENCOUNTER — Other Ambulatory Visit: Payer: Self-pay

## 2020-11-10 ENCOUNTER — Encounter: Payer: Self-pay | Admitting: Surgery

## 2020-11-10 ENCOUNTER — Ambulatory Visit: Payer: Medicare Other

## 2020-11-10 ENCOUNTER — Ambulatory Visit
Admission: RE | Admit: 2020-11-10 | Discharge: 2020-11-10 | Disposition: A | Discharge status: Home / Self Care | Payer: Medicare Other | Attending: Surgery | Admitting: Surgery

## 2020-11-10 DIAGNOSIS — Z7982 Long term (current) use of aspirin: Secondary | ICD-10-CM | POA: Diagnosis not present

## 2020-11-10 DIAGNOSIS — Z419 Encounter for procedure for purposes other than remedying health state, unspecified: Secondary | ICD-10-CM

## 2020-11-10 DIAGNOSIS — M75111 Incomplete rotator cuff tear or rupture of right shoulder, not specified as traumatic: Secondary | ICD-10-CM | POA: Insufficient documentation

## 2020-11-10 DIAGNOSIS — Z791 Long term (current) use of non-steroidal anti-inflammatories (NSAID): Secondary | ICD-10-CM | POA: Diagnosis not present

## 2020-11-10 DIAGNOSIS — M7521 Bicipital tendinitis, right shoulder: Secondary | ICD-10-CM | POA: Diagnosis not present

## 2020-11-10 DIAGNOSIS — Z7951 Long term (current) use of inhaled steroids: Secondary | ICD-10-CM | POA: Diagnosis not present

## 2020-11-10 DIAGNOSIS — J45909 Unspecified asthma, uncomplicated: Secondary | ICD-10-CM | POA: Diagnosis not present

## 2020-11-10 DIAGNOSIS — Z79899 Other long term (current) drug therapy: Secondary | ICD-10-CM | POA: Diagnosis not present

## 2020-11-10 HISTORY — PX: SHOULDER ARTHROSCOPY WITH SUBACROMIAL DECOMPRESSION, ROTATOR CUFF REPAIR AND BICEP TENDON REPAIR: SHX5687

## 2020-11-10 SURGERY — SHOULDER ARTHROSCOPY WITH SUBACROMIAL DECOMPRESSION, ROTATOR CUFF REPAIR AND BICEP TENDON REPAIR
Anesthesia: Regional | Site: Shoulder | Laterality: Right

## 2020-11-10 MED ORDER — ONDANSETRON HCL 4 MG/2ML IJ SOLN
INTRAMUSCULAR | Status: DC | PRN
  Administered 2020-11-10: 4 mg via INTRAVENOUS

## 2020-11-10 MED ORDER — CEFAZOLIN SODIUM-DEXTROSE 2-4 GM/100ML-% IV SOLN
2.0000 g | INTRAVENOUS | Status: AC
  Administered 2020-11-10: 2 g via INTRAVENOUS

## 2020-11-10 MED ORDER — LIDOCAINE HCL (CARDIAC) PF 100 MG/5ML IV SOSY
PREFILLED_SYRINGE | INTRAVENOUS | Status: DC | PRN
  Administered 2020-11-10: 100 mg via INTRAVENOUS

## 2020-11-10 MED ORDER — SUGAMMADEX SODIUM 200 MG/2ML IV SOLN
INTRAVENOUS | Status: DC | PRN
  Administered 2020-11-10: 200 mg via INTRAVENOUS

## 2020-11-10 MED ORDER — ROCURONIUM BROMIDE 100 MG/10ML IV SOLN
INTRAVENOUS | Status: DC | PRN
  Administered 2020-11-10: 20 mg via INTRAVENOUS
  Administered 2020-11-10: 50 mg via INTRAVENOUS

## 2020-11-10 MED ORDER — BUPIVACAINE-EPINEPHRINE (PF) 0.5% -1:200000 IJ SOLN
INTRAMUSCULAR | Status: AC
  Filled 2020-11-10: qty 30

## 2020-11-10 MED ORDER — BUPIVACAINE LIPOSOME 1.3 % IJ SUSP
INTRAMUSCULAR | Status: AC
  Filled 2020-11-10: qty 20

## 2020-11-10 MED ORDER — LACTATED RINGERS IV SOLN: INTRAVENOUS | Status: DC

## 2020-11-10 MED ORDER — ORAL CARE MOUTH RINSE: 15.0000 mL | Freq: Once | OROMUCOSAL | Status: AC

## 2020-11-10 MED ORDER — PHENYLEPHRINE HCL (PRESSORS) 10 MG/ML IV SOLN
INTRAVENOUS | Status: DC | PRN
  Administered 2020-11-10: 100 ug via INTRAVENOUS

## 2020-11-10 MED ORDER — BUPIVACAINE LIPOSOME 1.3 % IJ SUSP
INTRAMUSCULAR | Status: DC | PRN
  Administered 2020-11-10: 20 mL

## 2020-11-10 MED ORDER — PROPOFOL 10 MG/ML IV BOLUS
INTRAVENOUS | Status: AC
  Filled 2020-11-10: qty 20

## 2020-11-10 MED ORDER — EPINEPHRINE PF 1 MG/ML IJ SOLN
INTRAMUSCULAR | Status: AC
  Filled 2020-11-10: qty 4

## 2020-11-10 MED ORDER — BUPIVACAINE-EPINEPHRINE 0.5% -1:200000 IJ SOLN
INTRAMUSCULAR | Status: DC | PRN
  Administered 2020-11-10: 30 mL

## 2020-11-10 MED ORDER — DEXMEDETOMIDINE HCL IN NACL 400 MCG/100ML IV SOLN
INTRAVENOUS | Status: DC | PRN
  Administered 2020-11-10: 8 ug via INTRAVENOUS

## 2020-11-10 MED ORDER — MIDAZOLAM HCL 2 MG/2ML IJ SOLN
1.0000 mg | Freq: Once | INTRAMUSCULAR | Status: AC
  Administered 2020-11-10: 1 mg via INTRAVENOUS

## 2020-11-10 MED ORDER — PROPOFOL 10 MG/ML IV BOLUS
INTRAVENOUS | Status: DC | PRN
  Administered 2020-11-10: 150 mg via INTRAVENOUS

## 2020-11-10 MED ORDER — CEFAZOLIN SODIUM-DEXTROSE 2-4 GM/100ML-% IV SOLN
INTRAVENOUS | Status: AC
  Filled 2020-11-10: qty 100

## 2020-11-10 MED ORDER — FENTANYL CITRATE PF 50 MCG/ML IJ SOSY
PREFILLED_SYRINGE | INTRAMUSCULAR | Status: AC
  Administered 2020-11-10: 50 ug via INTRAVENOUS
  Filled 2020-11-10: qty 1

## 2020-11-10 MED ORDER — PHENYLEPHRINE 8 MG IN D5W 100 ML (0.08MG/ML) PREMIX OPTIME
INJECTION | INTRAVENOUS | Status: DC | PRN
  Administered 2020-11-10: 20 ug/min via INTRAVENOUS

## 2020-11-10 MED ORDER — LACTATED RINGERS IV SOLN
INTRAVENOUS | Status: DC | PRN
  Administered 2020-11-10: 3000 mL

## 2020-11-10 MED ORDER — ESMOLOL HCL 100 MG/10ML IV SOLN
INTRAVENOUS | Status: DC | PRN
  Administered 2020-11-10: 20 mg via INTRAVENOUS

## 2020-11-10 MED ORDER — DEXAMETHASONE SODIUM PHOSPHATE 10 MG/ML IJ SOLN
INTRAMUSCULAR | Status: DC | PRN
  Administered 2020-11-10: 10 mg via INTRAVENOUS

## 2020-11-10 MED ORDER — MIDAZOLAM HCL 2 MG/2ML IJ SOLN: 1.0000 mg | Freq: Once | INTRAMUSCULAR | Status: AC

## 2020-11-10 MED ORDER — BUPIVACAINE HCL (PF) 0.5 % IJ SOLN
INTRAMUSCULAR | Status: AC
  Filled 2020-11-10: qty 10

## 2020-11-10 MED ORDER — CHLORHEXIDINE GLUCONATE 0.12 % MT SOLN: 15.0000 mL | Freq: Once | OROMUCOSAL | Status: AC

## 2020-11-10 MED ORDER — FENTANYL CITRATE PF 50 MCG/ML IJ SOSY: 50.0000 ug | PREFILLED_SYRINGE | Freq: Once | INTRAMUSCULAR | Status: AC

## 2020-11-10 MED ORDER — BUPIVACAINE HCL (PF) 0.5 % IJ SOLN
INTRAMUSCULAR | Status: DC | PRN
  Administered 2020-11-10: 10 mL

## 2020-11-10 MED ORDER — OXYCODONE HCL 5 MG PO TABS: 5.0000 mg | ORAL_TABLET | ORAL | 0 refills | Status: DC | PRN

## 2020-11-10 MED ORDER — MIDAZOLAM HCL 2 MG/2ML IJ SOLN
INTRAMUSCULAR | Status: AC
  Administered 2020-11-10: 1 mg via INTRAVENOUS
  Filled 2020-11-10: qty 2

## 2020-11-10 MED ORDER — CHLORHEXIDINE GLUCONATE 0.12 % MT SOLN
OROMUCOSAL | Status: AC
  Administered 2020-11-10: 15 mL via OROMUCOSAL
  Filled 2020-11-10: qty 15

## 2020-11-10 SURGICAL SUPPLY — 56 items
ANCH SUT BN ASCP DLV (Anchor) ×1 IMPLANT
ANCH SUT RGNRT REGENETEN (Staple) ×1 IMPLANT
ANCHOR BONE REGENETEN (Anchor) ×1 IMPLANT
ANCHOR JUGGERKNOT WTAP NDL 2.9 (Anchor) IMPLANT
ANCHOR SUT QUATTRO KNTLS 4.5 (Anchor) IMPLANT
ANCHOR SUT W/ ORTHOCORD (Anchor) IMPLANT
ANCHOR TENDON REGENETEN (Staple) ×1 IMPLANT
APL PRP STRL LF DISP 70% ISPRP (MISCELLANEOUS) ×1
BIT DRILL JUGRKNT W/NDL BIT2.9 (DRILL) IMPLANT
BLADE FULL RADIUS 3.5 (BLADE) ×2 IMPLANT
BUR ACROMIONIZER 4.0 (BURR) ×2 IMPLANT
CANNULA SHAVER 8MMX76MM (CANNULA) ×2 IMPLANT
CHLORAPREP W/TINT 26 (MISCELLANEOUS) ×2 IMPLANT
COVER MAYO STAND REUSABLE (DRAPES) ×2 IMPLANT
DRAPE IMP U-DRAPE 54X76 (DRAPES) ×4 IMPLANT
DRILL JUGGERKNOT W/NDL BIT 2.9 (DRILL) ×4
ELECT CAUTERY BLADE 6.4 (BLADE) ×2 IMPLANT
ELECT REM PT RETURN 9FT ADLT (ELECTROSURGICAL) ×2
ELECTRODE REM PT RTRN 9FT ADLT (ELECTROSURGICAL) ×1 IMPLANT
GAUZE SPONGE 4X4 12PLY STRL (GAUZE/BANDAGES/DRESSINGS) ×2 IMPLANT
GAUZE XEROFORM 1X8 LF (GAUZE/BANDAGES/DRESSINGS) ×2 IMPLANT
GLOVE SRG 8 PF TXTR STRL LF DI (GLOVE) ×1 IMPLANT
GLOVE SURG ENC MOIS LTX SZ7.5 (GLOVE) ×4 IMPLANT
GLOVE SURG ENC MOIS LTX SZ8 (GLOVE) ×4 IMPLANT
GLOVE SURG UNDER LTX SZ8 (GLOVE) ×2 IMPLANT
GLOVE SURG UNDER POLY LF SZ8 (GLOVE) ×2
GOWN STRL REUS W/ TWL LRG LVL3 (GOWN DISPOSABLE) ×1 IMPLANT
GOWN STRL REUS W/ TWL XL LVL3 (GOWN DISPOSABLE) ×1 IMPLANT
GOWN STRL REUS W/TWL LRG LVL3 (GOWN DISPOSABLE) ×2
GOWN STRL REUS W/TWL XL LVL3 (GOWN DISPOSABLE) ×2
GRASPER SUT 15 45D LOW PRO (SUTURE) IMPLANT
IMPL REGENETEN MEDIUM (Shoulder) ×1 IMPLANT
IMPLANT REGENETEN MEDIUM (Shoulder) ×2 IMPLANT
IV LACTATED RINGER IRRG 3000ML (IV SOLUTION) ×2
IV LR IRRIG 3000ML ARTHROMATIC (IV SOLUTION) ×2 IMPLANT
KIT CANNULA 8X76-LX IN CANNULA (CANNULA) IMPLANT
MANIFOLD NEPTUNE II (INSTRUMENTS) ×4 IMPLANT
MASK FACE SPIDER DISP (MASK) ×2 IMPLANT
MAT ABSORB  FLUID 56X50 GRAY (MISCELLANEOUS) ×2
MAT ABSORB FLUID 56X50 GRAY (MISCELLANEOUS) ×1 IMPLANT
PACK ARTHROSCOPY SHOULDER (MISCELLANEOUS) ×2 IMPLANT
PASSER SUT FIRSTPASS SELF (INSTRUMENTS) IMPLANT
SLING ARM LRG DEEP (SOFTGOODS) ×2 IMPLANT
SLING ULTRA II LG (MISCELLANEOUS) ×2 IMPLANT
SPONGE T-LAP 18X18 ~~LOC~~+RFID (SPONGE) ×2 IMPLANT
STAPLER SKIN PROX 35W (STAPLE) ×2 IMPLANT
STRAP SAFETY 5IN WIDE (MISCELLANEOUS) ×2 IMPLANT
SUT ETHIBOND 0 MO6 C/R (SUTURE) ×2 IMPLANT
SUT ULTRABRAID 2 COBRAID 38 (SUTURE) IMPLANT
SUT VIC AB 2-0 CT1 27 (SUTURE) ×4
SUT VIC AB 2-0 CT1 TAPERPNT 27 (SUTURE) ×2 IMPLANT
TAPE MICROFOAM 4IN (TAPE) ×2 IMPLANT
TUBING CONNECTING 10 (TUBING) ×2 IMPLANT
TUBING INFLOW SET DBFLO PUMP (TUBING) ×2 IMPLANT
WAND WEREWOLF FLOW 90D (MISCELLANEOUS) ×2 IMPLANT
WATER STERILE IRR 500ML POUR (IV SOLUTION) ×2 IMPLANT

## 2020-11-10 NOTE — Transfer of Care (Signed)
Immediate Anesthesia Transfer of Care Note  Patient: Brian Forbes  Procedure(s) Performed: SHOULDER ARTHROSCOPY WITH DEBRIDEMENT, DECOMPRESSION, PARTIAL THICKNESS ROTATOR CUFF REPAIR AND BICEP TENODESIS (Right: Shoulder)  Patient Location: PACU  Anesthesia Type:General  Level of Consciousness: awake  Airway & Oxygen Therapy: Patient Spontanous Breathing  Post-op Assessment: Report given to RN  Post vital signs: stable  Last Vitals:  Vitals Value Taken Time  BP 127/86 11/10/20 1245  Temp    Pulse 70 11/10/20 1247  Resp 19 11/10/20 1247  SpO2 100 % 11/10/20 1247  Vitals shown include unvalidated device data.  Last Pain:  Vitals:   11/10/20 0905  TempSrc: Temporal  PainSc: 8          Complications: No notable events documented.

## 2020-11-10 NOTE — Anesthesia Preprocedure Evaluation (Addendum)
Anesthesia Evaluation  Patient identified by MRN, date of birth, ID band Patient awake    Reviewed: Allergy & Precautions, NPO status , Patient's Chart, lab work & pertinent test results  History of Anesthesia Complications Negative for: history of anesthetic complications  Airway Mallampati: III   Neck ROM: Full    Dental  (+) Partial Lower, Partial Upper   Pulmonary asthma , Current Smoker (5 cigarettes per day) and Patient abstained from smoking.,    Pulmonary exam normal breath sounds clear to auscultation       Cardiovascular hypertension, Normal cardiovascular exam Rhythm:Regular Rate:Normal  ECG 11/04/20: normal   Neuro/Psych negative neurological ROS     GI/Hepatic GERD  ,  Endo/Other  negative endocrine ROS  Renal/GU negative Renal ROS     Musculoskeletal  (+) Arthritis ,   Abdominal   Peds  Hematology negative hematology ROS (+)   Anesthesia Other Findings   Reproductive/Obstetrics                            Anesthesia Physical Anesthesia Plan  ASA: 2  Anesthesia Plan: General and Regional   Post-op Pain Management:  Regional for Post-op pain and GA combined w/ Regional for post-op pain   Induction: Intravenous  PONV Risk Score and Plan: 1 and Ondansetron, Dexamethasone and Treatment may vary due to age or medical condition  Airway Management Planned: Oral ETT  Additional Equipment:   Intra-op Plan:   Post-operative Plan: Extubation in OR  Informed Consent: I have reviewed the patients History and Physical, chart, labs and discussed the procedure including the risks, benefits and alternatives for the proposed anesthesia with the patient or authorized representative who has indicated his/her understanding and acceptance.       Plan Discussed with: CRNA  Anesthesia Plan Comments:        Anesthesia Quick Evaluation

## 2020-11-10 NOTE — Anesthesia Procedure Notes (Signed)
Procedure Name: Intubation Date/Time: 11/10/2020 11:07 AM Performed by: Carter Kitten, CRNA Pre-anesthesia Checklist: Patient identified, Patient being monitored, Timeout performed, Emergency Drugs available and Suction available Patient Re-evaluated:Patient Re-evaluated prior to induction Oxygen Delivery Method: Circle system utilized Preoxygenation: Pre-oxygenation with 100% oxygen Induction Type: IV induction Ventilation: Mask ventilation without difficulty Laryngoscope Size: 3 and McGraph Grade View: Grade I Tube type: Oral Tube size: 7.0 mm Number of attempts: 1 Airway Equipment and Method: Stylet Placement Confirmation: ETT inserted through vocal cords under direct vision, positive ETCO2 and breath sounds checked- equal and bilateral Secured at: 21 cm Tube secured with: Tape Dental Injury: Teeth and Oropharynx as per pre-operative assessment

## 2020-11-10 NOTE — H&P (Signed)
History of Present Illness: Brian Forbes is a 65 y.o. male who presents today for repeat evaluation of right shoulder pain. The patient has been experiencing right shoulder pain over the past year, he was last evaluated earlier this year and an MRI scan was ordered and detailed in the imaging section below. The MRI scan of the right shoulder did demonstrate moderate supraspinatus and infraspinatus tendinopathy with partial-thickness articular surface tearing of the supinators tendon. The patient did also undergo a nerve conduction study which was negative for any large fiber compression. The patient was offered and received a right subacromial steroid injection in July of this year which she states did provide significant relief however the pain has been returning over the past 3 weeks. Pain score at today's visit is a 5 out of 10. He still reports increased discomfort reaching above his head and out to the side. He denies any numbness or tingling at today's visit. He denies any repeat falls or trauma affecting the right shoulder. The patient is quite frustrated with his continued right shoulder pain especially when trying to reach above his head. He denies any personal history of heart attack, stroke or blood clot. He does have a history of asthma and does use a rescue inhaler as needed.  Past Medical History:  Asthma, unspecified asthma severity, whether complicated or persistent   Dyslipidemia   GERD (gastroesophageal reflux disease)   Hypertension   Past Surgical History:  COLONOSCOPY 03/19/2018 With removal tumor polyp   Past Family History:  Cancer Mother   Pancreatitis Father   Medications:  albuterol 90 mcg/actuation inhaler Inhale 2 inhalations into the lungs every 6 (six) hours as needed   amLODIPine-benazepril (LOTREL) 10-20 mg capsule Take 1 capsule by mouth once daily   aspirin 81 MG EC tablet Take 81 mg by mouth once daily   atorvastatin (LIPITOR) 10 MG tablet Take 1 tablet by  mouth once daily   budesonide-formoteroL (SYMBICORT) 80-4.5 mcg/actuation inhaler Inhale 2 inhalations into the lungs 2 (two) times daily   gabapentin (NEURONTIN) 100 MG capsule Take 1 capsule (100 mg total) by mouth 2 (two) times daily 60 capsule 1   naproxen (NAPROSYN) 500 MG tablet Take 500 mg by mouth 2 (two) times daily with meals   naproxen sodium (ALEVE) 220 MG tablet Take 220 mg by mouth once daily   omeprazole (PRILOSEC) 20 MG DR capsule Take 20 mg by mouth once daily   Allergies: No Known Allergies   Review of Systems:  A comprehensive 14 point ROS was performed, reviewed by me today, and the pertinent orthopaedic findings are documented in the HPI.  Physical Exam: BP 138/86  Ht 165.1 cm (5\' 5" )  Wt 75.3 kg (166 lb)  BMI 27.62 kg/m  General/Constitutional: The patient appears to be well-nourished, well-developed, and in no acute distress. Neuro/Psych: Normal mood and affect, oriented to person, place and time. Eyes: Non-icteric. Pupils are equal, round, and reactive to light, and exhibit synchronous movement. ENT: Unremarkable. Lymphatic: No palpable adenopathy. Respiratory: Non-labored breathing Cardiovascular: Regular rate and rhythm. No murmurs. and No edema, swelling or tenderness, except as noted in detailed exam. Integumentary: No impressive skin lesions present, except as noted in detailed exam. Musculoskeletal: Unremarkable, except as noted in detailed exam.  General: Well developed, well nourished 65 y.o. male in no apparent distress. Normal affect. Normal communication. Patient answers questions appropriately. The patient has a normal gait. There is no antalgic component. There is no hip lurch.   The patient is  able to fully extend the cervical spine, full flexion. The patient has full right and left bend and rotation. He is nontender palpation along the medial border of the scapula. No evidence of scapular winging. Some discomfort palpation along the superior  aspect of the scapula. Nontender palpation over the vertebral bodies of the cervical spine. Negative Spurling's test, negative overhead test for radiculopathy symptoms.  Right Upper Extremity: Examination of the right shoulder and arm showed no bony abnormality or edema. The patient has normal active and passive motion with abduction, flexion, internal rotation, and external rotation. The patient does have pain when reaching 95 degrees of forward flexion and 90 degrees of abduction. With the right arm abducted 90 degrees he can tolerate external rotation 70 degrees, internal rotation 60 degrees. The patient has a positive Hawkins test and a positive Impingement test. The patient has a positive drop arm test. The patient is tender along the deltoid muscle. There is moderate subacromial space tenderness with no AC joint tenderness. Positive speeds test to the right arm, tenderness with palpation to the right proximal biceps tendon. The patient has no instability of the shoulder with anterior-posterior motion. There is a negative sulcus sign. The rotator cuff muscle strength is 3-4/5 with supraspinatus, 4/5 with internal rotation, and 4/5 with external rotation. There is no crepitus with range of motion activities.   Neurological: The patient has sensation that is intact to light touch and pinprick bilaterally. The patient has normal grip strength. The patient has full biceps, wrist extension, grip, and interosseous strength. The patient has 2 + DTRs bilaterally. The patient does have a negative Tinel's test. Negative right carpal tunnel compression test.  Vascular: The patient has less than 2 second capillary refill. The patient has normal ulnar and radial pulses. The patient has normal warmth to touch.   Imaging: Previous x-rays obtained in the clinic were reviewed today. These x-rays demonstrate no evidence of fractures, lytic lesions or significant degenerative changes. No subacromial spurring noted.  Patient does have a type I acromion. These x-rays were compared to the x-rays obtained in the emergency room, there were no acute changes.  MRI OF THE RIGHT SHOULDER:  1. Moderate supraspinatus and infraspinatus tendinopathy with  partial thickness articular surface tearing of the distal anterior  supraspinatus tendon. Incidental small focus of synovitis interposed  between the supraspinatus and infraspinatus muscles.  2. Mild degenerative AC joint arthropathy and mild degenerative  chondral thinning in the glenohumeral joint.  3. Trace subacromial subdeltoid bursitis.  4. Mesoacromial os acromiale.   Nerve conduction study of the right upper extremity was negative for any large fiber compression. It did demonstrate decreased conduction velocity at the right median nerve.  Impression: 1. Right rotator cuff tendonitis. 2. Incomplete tear of right rotator cuff, unspecified whether traumatic. 3. Biceps tendinitis of right upper extremity. 4. Subacromial impingement of right shoulder.  Plan:  1. Treatment options were discussed today with the patient. 2. The patient did experience significant relief however it did not last longer than 1 month after his subacromial steroid injection. The patient is quite frustrated by his continued right shoulder pain. 3. The patient was instructed on the risk and benefits of surgical intervention and wishes to proceed at this time. The patient will be scheduled for a right shoulder arthroscopy with debridement, decompression, possible rotator cuff repair and probable biceps tenodesis with Dr. Joice Lofts. 4. The patient was instructed on the risk and benefits of surgery and wishes to proceed. The patient was offered  and received a right shoulder slingshot sling to wear following surgery. 5. This document will serve as a surgical history and physical for the patient. 6. The patient will follow-up per standard postop protocol. 7. The patient can contact the clinic if  he has any questions, new symptoms develop or symptoms worsen.  The procedure was discussed with the patient, as were the potential risks (including bleeding, infection, nerve and/or blood vessel injury, persistent or recurrent pain, failure of the repair, progression of arthritis, need for further surgery, blood clots, strokes, heart attacks and/or arhythmias, pneumonia, etc.) and benefits. The patient states his understanding and wishes to proceed.   H&P reviewed and patient re-examined. No changes.

## 2020-11-10 NOTE — Anesthesia Procedure Notes (Signed)
Anesthesia Regional Block: Interscalene brachial plexus block   Pre-Anesthetic Checklist: , timeout performed,  Correct Patient, Correct Site, Correct Laterality,  Correct Procedure,, risks and benefits discussed,  Surgical consent,  Pre-op evaluation,  At surgeon's request and post-op pain management  Laterality: Right  Prep: chloraprep       Needles:  Injection technique: Single-shot  Needle Type: Echogenic Needle          Additional Needles:   Procedures:,,,, ultrasound used (permanent image in chart),,   Motor weakness within 20 minutes.  Narrative:  Start time: 11/10/2020 9:32 AM End time: 11/10/2020 9:40 AM Injection made incrementally with aspirations every 5 mL.  Performed by: Personally  Anesthesiologist: Reed Breech, MD  Additional Notes: 20ml Exparel and 4ml bupivacaine 0.5% given in interscalene distribution.

## 2020-11-10 NOTE — Anesthesia Postprocedure Evaluation (Signed)
Anesthesia Post Note  Patient: VIKAS WEGMANN  Procedure(s) Performed: SHOULDER ARTHROSCOPY WITH DEBRIDEMENT, DECOMPRESSION, PARTIAL THICKNESS ROTATOR CUFF REPAIR AND BICEP TENODESIS (Right: Shoulder)  Patient location during evaluation: PACU Anesthesia Type: Regional Level of consciousness: awake and alert, oriented and patient cooperative Pain management: pain level controlled Vital Signs Assessment: post-procedure vital signs reviewed and stable Respiratory status: spontaneous breathing, nonlabored ventilation and respiratory function stable Cardiovascular status: blood pressure returned to baseline and stable Postop Assessment: adequate PO intake Anesthetic complications: no   No notable events documented.   Last Vitals:  Vitals:   11/10/20 1315 11/10/20 1336  BP: 118/71 134/74  Pulse: 74 67  Resp: 15 15  Temp: 36.5 C (!) 36.3 C  SpO2: 98% 100%    Last Pain:  Vitals:   11/10/20 1336  TempSrc: Temporal  PainSc: 0-No pain                 Reed Breech

## 2020-11-10 NOTE — Progress Notes (Signed)
Notified Dr. Joice Lofts that patient needs a work note for his employer.

## 2020-11-10 NOTE — Discharge Instructions (Addendum)
Orthopedic discharge instructions: Keep dressing dry and intact.  May shower after dressing changed on post-op day #4 (Saturday).  Cover staples with Band-Aids after drying off. Apply ice frequently to shoulder. Take ibuprofen 600-800 mg TID with meals for 7-10 days, then as necessary. Take oxycodone as prescribed when needed.  May supplement with ES Tylenol if necessary. Keep shoulder immobilizer on at all times except may remove for bathing purposes. Follow-up in 10-14 days or as scheduled.  AMBULATORY SURGERY  DISCHARGE INSTRUCTIONS   The drugs that you were given will stay in your system until tomorrow so for the next 24 hours you should not:  Drive an automobile Make any legal decisions Drink any alcoholic beverage   You may resume regular meals tomorrow.  Today it is better to start with liquids and gradually work up to solid foods.  You may eat anything you prefer, but it is better to start with liquids, then soup and crackers, and gradually work up to solid foods.   Please notify your doctor immediately if you have any unusual bleeding, trouble breathing, redness and pain at the surgery site, drainage, fever, or pain not relieved by medication.    Additional Instructions:        Please contact your physician with any problems or Same Day Surgery at [phone], Monday through Friday 6 am to 4 pm, or [place] at [facility] number at [phone].   SHOULDER SLING IMMOBILIZER   VIDEO Slingshot 2 Shoulder Brace Application - YouTube ---[email or web address]  INSTRUCTIONS While supporting the injured arm, slide the forearm into the sling. Wrap the adjustable shoulder strap around the neck and shoulders and attach the strap end to the sling using  the "alligator strap tab."  Adjust the shoulder strap to the required length. Position the shoulder pad behind the neck. To secure the shoulder pad location (optional), pull the shoulder  strap away from the shoulder pad, unfold the hook material on the top of the pad, then press the shoulder strap back onto the hook material to secure the pad in place. Attach the closure strap across the open top of the sling. Position the strap so that it holds the arm securely in the sling. Next, attach the thumb strap to the open end of the sling between the thumb and fingers. After sling has been fit, it may be easily removed and reapplied using the quick release buckle on shoulder strap. If a neutral pillow or 15 abduction pillow is included, place the pillow at the waistline. Attach the sling to the pillow, lining up hook material on the pillow with the loop on sling. Adjust the waist strap to fit.  If waist strap is too long, cut it to fit. Use the small piece of double sided hook material (located on top of the pillow) to secure the strap end. Place the double sided hook material on the inside of the cut strap end and secure it to the waist strap.     If no pillow is included, attach the waist strap to the sling and adjust to fit.    Washing Instructions: Straps and sling must be removed and cleaned regularly depending on your activity level and perspiration. Hand wash straps and sling in cold water with mild detergent, rinse, air dry       Interscalene Nerve Block with Exparel   For your surgery you have received an Interscalene Nerve Block with Exparel. Nerve Blocks affect many types of nerves, including nerves that control movement,  pain and normal sensation.  You may experience feelings such as numbness, tingling, heaviness, weakness or the inability to move your arm or the feeling or sensation that your arm has "fallen asleep". A nerve block with Exparel can last up to 5 days.  Usually the weakness wears off first.  The tingling and heaviness usually wear off next.  Finally you may start to notice pain.  Keep in mind that this may occur in any order.  Once a nerve block starts to wear off  it is usually completely gone within 60 minutes. ISNB may cause mild shortness of breath, a hoarse voice, blurry vision, unequal pupils, or drooping of the face on the same side as the nerve block.  These symptoms will usually resolve with the numbness.  Very rarely the procedure itself can cause mild seizures. If needed, your surgeon will give you a prescription for pain medication.  It will take about 60 minutes for the oral pain medication to become fully effective.  So, it is recommended that you start taking this medication before the nerve block first begins to wear off, or when you first begin to feel discomfort. Take your pain medication only as prescribed.  Pain medication can cause sedation and decrease your breathing if you take more than you need for the level of pain that you have. Nausea is a common side effect of many pain medications.  You may want to eat something before taking your pain medicine to prevent nausea. After an Interscalene nerve block, you cannot feel pain, pressure or extremes in temperature in the effected arm.  Because your arm is numb it is at an increased risk for injury.  To decrease the possibility of injury, please practice the following:  While you are awake change the position of your arm frequently to prevent too much pressure on any one area for prolonged periods of time.  If you have a cast or tight dressing, check the color or your fingers every couple of hours.  Call your surgeon with the appearance of any discoloration (white or blue). If you are given a sling to wear before you go home, please wear it  at all times until the block has completely worn off.  Do not get up at night without your sling. Please contact [facility] or your surgeon if you do not begin to regain sensation after 7 days from the surgery.  Anesthesia may be contacted by calling the Same Day Surgery Department, Mon. through Fri., 6 am to 4 pm at [phone].   If you experience any  other problems or concerns, please contact your surgeon's office. If you experience severe or prolonged shortness of breath go to the nearest emergency department.

## 2020-11-10 NOTE — Op Note (Signed)
11/10/2020  12:28 PM  Patient:   Brian Forbes  Pre-Op Diagnosis:   Impingement/tendinopathy with partial-thickness rotator cuff tear, right shoulder.  Post-Op Diagnosis:   Impingement/tendinopathy with rotator cuff tear and biceps tendinopathy, right shoulder.  Procedure:   Limited arthroscopic debridement, arthroscopic subacromial decompression, mini-open rotator cuff repair using a Horris Latino, PA-C; Ryan loll, PAS, and mini-open biceps tenodesis, right shoulder.  Anesthesia:   General endotracheal with interscalene block using Exparel placed preoperatively by the anesthesiologist.  Surgeon:   Maryagnes Amos, MD  Assistant:   Horris Latino, PA-C; Frederic Jericho, PA-S  Findings:   As above.  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  There was an articular-sided partial-thickness tear of the supraspinatus insertional fibers involving at most 10% of the footprint.  The remainder the rotator cuff was in satisfactory condition.  There was a moderate "lip sticking" of the biceps tendon without partial or full-thickness tearing.  The humeral articular surface appeared to be in excellent condition.  Complications:   None  Fluids:   1100 cc  Estimated blood loss:   5 cc  Tourniquet time:   None  Drains:   None  Closure:   Staples      Brief clinical note:   The patient is a 65 year old male with a history of progressively worsening right shoulder pain. The patient's symptoms have progressed despite medications, activity modification, etc. The patient's history and examination are consistent with impingement/tendinopathy with a rotator cuff tear. These findings were confirmed by MRI scan. The patient presents at this time for definitive management of these shoulder symptoms.  Procedure:   The patient underwent placement of an interscalene block using Exparel by the anesthesiologist in the  preoperative holding area before being brought into the operating room and lain in the supine position. The patient then underwent general endotracheal intubation and anesthesia before being repositioned in the beach chair position using the beach chair positioner. The right shoulder and upper extremity were prepped with ChloraPrep solution before being draped sterilely. Preoperative antibiotics were administered. A timeout was performed to confirm the proper surgical site before the expected portal sites and incision site were injected with 0.5% Sensorcaine with epinephrine.   A posterior portal was created and the glenohumeral joint thoroughly inspected with the findings as described above. An anterior portal was created using an outside-in technique. The labrum and rotator cuff were further probed, again confirming the above-noted findings. Areas of labral fraying superiorly were debrided back to stable margins using the full-radius resector, as were areas of synovitis. The frayed portion of the articular-sided partial-thickness tear of the supraspinatus tendon also was debrided back to stable margins using the full-radius resector. The ArthroCare wand was inserted and used to release the biceps tendon from its labral anchor. It also was used to obtain hemostasis as well as to "anneal" the labrum superiorly and anteriorly. The instruments were removed from the joint after suctioning the excess fluid.  The camera was repositioned through the posterior portal into the subacromial space. A separate lateral portal was created using an outside-in technique. The 3.5 mm full-radius resector was introduced and used to perform a subtotal bursectomy. The ArthroCare wand was then inserted and used to remove the periosteal tissue off the undersurface of the anterior third of the acromion as well as to recess the coracoacromial ligament from its attachment along the anterior and lateral margins of the  acromion. The 4.0 mm  acromionizing bur was introduced and used to complete the decompression by removing the undersurface of the anterior third of the acromion. The full radius resector was reintroduced to remove any residual bony debris before the ArthroCare wand was reintroduced to obtain hemostasis. The instruments were then removed from the subacromial space after suctioning the excess fluid.  An approximately 4-5 cm incision was made over the anterolateral aspect of the shoulder beginning at the anterolateral corner of the acromion and extending distally in line with the bicipital groove. This incision was carried down through the subcutaneous tissues to expose the deltoid fascia. The raphae between the anterior and middle thirds was identified and this plane developed to provide access into the subacromial space. Additional bursal tissues were debrided sharply using Metzenbaum scissors. The rotator cuff was carefully inspected from its bursal side. The bursal surface appeared to be intact, although a slightly thinned area was palpable in the superior insertional portion of the supraspinatus just posterior to the bicipital groove, consistent with the articular sided partial-thickness tear observed intra-articularly. It was elected to treat this by application of a [employer] Regeneten patch rather than taking down the remaining intact fibers and performing a formal repair. The medium patch was selected and applied over the supraspinatus tendon in the area of the thinned portion identified by palpation. This patch was secured using the appropriate bone and soft tissue staples. An apparent watertight closure was obtained.  The bicipital groove was identified by palpation and opened for 1-1.5 cm. The biceps tendon stump was retrieved through this defect. The floor of the bicipital groove was roughened with a curet before a a single Biomet 2.9 mm JuggerKnot anchor was inserted. Both sets of sutures were passed through the  biceps tendon and tied securely to effect the tenodesis. The bicipital sheath was reapproximated using two #0 Ethibond interrupted sutures, incorporating the biceps tendon to further reinforce the tenodesis.  The wound was copiously irrigated with sterile saline solution before the deltoid raphae was reapproximated using 2-0 Vicryl interrupted sutures. The subcutaneous tissues were closed in two layers using 2-0 Vicryl interrupted sutures before the skin was closed using staples. The portal sites also were closed using staples. A sterile bulky dressing was applied to the shoulder before the arm was placed into a shoulder immobilizer. The patient was then awakened, extubated, and returned to the recovery room in satisfactory condition after tolerating the procedure well.

## 2021-02-24 ENCOUNTER — Encounter: Payer: Self-pay | Admitting: Surgery

## 2021-08-24 ENCOUNTER — Emergency Department
Admission: EM | Admit: 2021-08-24 | Discharge: 2021-08-24 | Disposition: A | Discharge status: Home / Self Care | Payer: Medicare HMO | Attending: Emergency Medicine | Admitting: Emergency Medicine

## 2021-08-24 ENCOUNTER — Encounter: Payer: Self-pay | Admitting: Medical Oncology

## 2021-08-24 ENCOUNTER — Emergency Department: Payer: Medicare HMO

## 2021-08-24 ENCOUNTER — Other Ambulatory Visit: Payer: Self-pay

## 2021-08-24 DIAGNOSIS — R229 Localized swelling, mass and lump, unspecified: Secondary | ICD-10-CM | POA: Diagnosis not present

## 2021-08-24 DIAGNOSIS — M7989 Other specified soft tissue disorders: Secondary | ICD-10-CM

## 2021-08-24 DIAGNOSIS — M79605 Pain in left leg: Secondary | ICD-10-CM | POA: Diagnosis present

## 2021-08-24 MED ORDER — NAPROXEN 500 MG PO TABS: 500.0000 mg | ORAL_TABLET | Freq: Two times a day (BID) | ORAL | 0 refills | Status: DC

## 2021-08-24 NOTE — ED Provider Notes (Signed)
[facility] Provider Note    Event Date/Time   First MD Initiated Contact with Patient 08/24/21 1006     (approximate)   History   Leg Pain   HPI  Brian Forbes is a 66 y.o. male   presents to the ED after being seen at [facility] urgent care and was told to come to the emergency department to rule out a possible DVT to his left leg.  Patient noticed last week that he had a small knot under his skin.  He is unaware of any trauma to his leg and denies any recent long-term travels or previous DVTs.      Physical Exam   Triage Vital Signs: ED Triage Vitals  Enc Vitals Group     BP 08/24/21 0858 (!) 159/95     Pulse Rate 08/24/21 0858 76     Resp 08/24/21 0858 17     Temp 08/24/21 0858 98.3 F (36.8 C)     Temp Source 08/24/21 0858 Oral     SpO2 08/24/21 0858 100 %     Weight 08/24/21 0859 175 lb (79.4 kg)     Height 08/24/21 0859 5\' 4"  (1.626 m)     Head Circumference --      Peak Flow --      Pain Score 08/24/21 0859 10     Pain Loc --      Pain Edu? --      Excl. in GC? --     Most recent vital signs: Vitals:   08/24/21 0858 08/24/21 1054  BP: (!) 159/95 (!) 150/90  Pulse: 76 70  Resp: 17 17  Temp: 98.3 F (36.8 C)   SpO2: 100% 100%     General: Awake, no distress.  Ambulatory without any assistance. CV:  Good peripheral perfusion.  Resp:  Normal effort.  Abd:  No distention.  Other:  Examination of the left lower extremity there is a superficial palpable nodule without erythema or warmth noted anterior lateral mid lower extremity.  No streaking.  No other deformity.  08/26/21' sign was negative.   ED Results / Procedures / Treatments   Labs (all labs ordered are listed, but only abnormal results are displayed) Labs Reviewed - No data to display    RADIOLOGY This ultrasound of the left lower extremity per radiologist was negative for DVT.    PROCEDURES:  Critical Care performed:   Procedures   MEDICATIONS  ORDERED IN ED: Medications - No data to display   IMPRESSION / MDM / ASSESSMENT AND PLAN / ED COURSE  I reviewed the triage vital signs and the nursing notes.   Differential diagnosis includes, but is not limited to, left lower extremity DVT, musculoskeletal pain, abscess.  66 year old male presents to the ED with concerns of a soft tissue nodule that he noticed 1 week ago on his leg without history of injury.  Patient denies any previous trauma or recent traveling or DVTs.  Ultrasound of the left lower extremity was negative for DVT.  On exam there is a soft superficial nodule without erythema, warmth or suspicion for a foreign body.  Area is soft and does not restrict weightbearing or gait.  Patient will be placed on a anti-inflammatory and he is to follow-up with his PCP if any continued problems or concerns.      Patient's presentation is most consistent with acute complicated illness / injury requiring diagnostic workup.  FINAL CLINICAL IMPRESSION(S) / ED DIAGNOSES   Final  diagnoses:  Leg pain, lateral, left  Nodule of soft tissue     Rx / DC Orders   ED Discharge Orders          Ordered    naproxen (NAPROSYN) 500 MG tablet  2 times daily with meals        08/24/21 1048             Note:  This document was prepared using Dragon voice recognition software and may include unintentional dictation errors.   Tommi Rumps, PA-C 08/24/21 1058    Gilles Chiquito, MD 08/24/21 [phone]

## 2021-08-24 NOTE — Discharge Instructions (Addendum)
Call make an appoint with your primary care provider if any continued problems or concerns.  You may use ice or heat to your leg as needed for discomfort.  A prescription for naproxen 500 mg twice daily with food was sent to your pharmacy to help with inflammation.  Your test today does not show a blood clot in your left leg.

## 2021-08-24 NOTE — ED Notes (Signed)
See triage note  Presents with some left leg pain for a few days denies any injury Noticed a knot to left outer aspect of leg

## 2021-08-24 NOTE — ED Triage Notes (Signed)
Pt ambulatory with reports that he began last week having pain to left leg on the outter aspect of his calf with a small knot under the skin, went to [facility] and was sent for possible DVT. Pt denies sob.

## 2021-10-05 ENCOUNTER — Other Ambulatory Visit: Payer: Self-pay | Admitting: Student

## 2021-10-05 DIAGNOSIS — M47816 Spondylosis without myelopathy or radiculopathy, lumbar region: Secondary | ICD-10-CM

## 2021-10-05 DIAGNOSIS — M5416 Radiculopathy, lumbar region: Secondary | ICD-10-CM

## 2021-10-05 DIAGNOSIS — M48062 Spinal stenosis, lumbar region with neurogenic claudication: Secondary | ICD-10-CM

## 2021-10-20 ENCOUNTER — Other Ambulatory Visit: Payer: Medicare HMO

## 2022-02-24 DIAGNOSIS — J4530 Mild persistent asthma, uncomplicated: Secondary | ICD-10-CM | POA: Diagnosis not present

## 2022-02-24 DIAGNOSIS — J209 Acute bronchitis, unspecified: Secondary | ICD-10-CM | POA: Diagnosis not present

## 2022-03-11 ENCOUNTER — Telehealth: Payer: Self-pay | Admitting: Family Medicine

## 2022-03-11 NOTE — Telephone Encounter (Addendum)
Brian Forbes from [facility] is calling to see why pt was told there would be a $150 copay for his new pt appointment.   Please advise.

## 2022-03-14 NOTE — Telephone Encounter (Signed)
L/M for pt  twice this is only an estimate if he do not bring in  insurance cards

## 2022-03-20 ENCOUNTER — Other Ambulatory Visit: Payer: Self-pay

## 2022-03-20 ENCOUNTER — Emergency Department: Payer: No Typology Code available for payment source

## 2022-03-20 ENCOUNTER — Emergency Department
Admission: EM | Admit: 2022-03-20 | Discharge: 2022-03-20 | Disposition: A | Discharge status: Home / Self Care | Payer: No Typology Code available for payment source | Attending: Emergency Medicine | Admitting: Emergency Medicine

## 2022-03-20 DIAGNOSIS — M25561 Pain in right knee: Secondary | ICD-10-CM | POA: Insufficient documentation

## 2022-03-20 DIAGNOSIS — M25461 Effusion, right knee: Secondary | ICD-10-CM | POA: Diagnosis not present

## 2022-03-20 MED ORDER — PREDNISONE 10 MG PO TABS: 10.0000 mg | ORAL_TABLET | Freq: Every day | ORAL | 0 refills | Status: DC

## 2022-03-20 NOTE — Discharge Instructions (Signed)
Please rest ice and elevate the right knee.  Call your orthopedist in 1 week if no improvement with prednisone.  Return to the ER for any fevers, warmth redness increasing pain or any urgent changes in your health

## 2022-03-20 NOTE — ED Provider Notes (Signed)
[facility] Provider Note   CSN: [id number] Arrival date & time: 03/20/22  [id number]     History  Chief Complaint  Patient presents with   Knee Pain    Brian Forbes is a 67 y.o. male.  With no past medical history presents to the emergency department for evaluation of right knee pain.  Has had knee pain for several weeks describes sharp pain along the medial joint line with occasional swelling.  Denies any trauma or injury.  He has not been take any medications for pain.  Denies any warmth redness or fevers.  No calf pain or lower leg swelling.  Denies any history of knee pain or previous x-rays indicating arthritis.  HPI     Home Medications Prior to Admission medications   Medication Sig Start Date End Date Taking? Authorizing Provider  albuterol (VENTOLIN HFA) 108 (90 Base) MCG/ACT inhaler Inhale 1-2 puffs into the lungs every 6 (six) hours as needed for wheezing or shortness of breath.    [provider]  amLODipine-benazepril (LOTREL) 10-20 MG capsule Take 1 capsule by mouth every morning. 08/24/20   [provider]  atorvastatin (LIPITOR) 10 MG tablet Take 10 mg by mouth every morning. 08/11/20   [provider]  naproxen (NAPROSYN) 500 MG tablet Take 1 tablet (500 mg total) by mouth 2 (two) times daily with a meal. 08/24/21   Johnn Hai, PA-C  omeprazole (PRILOSEC) 20 MG capsule Take 20 mg by mouth every morning.    [provider]      Allergies    Patient has no known allergies.    Review of Systems   Review of Systems  Physical Exam Updated Vital Signs BP 115/65 (BP Location: Left Arm)   Pulse 80   Temp 98.2 F (36.8 C) (Oral)   Resp 18   Ht 5' 5"$  (1.651 m)   Wt 79.4 kg   SpO2 95%   BMI 29.12 kg/m  Physical Exam Constitutional:      Appearance: He is well-developed.  HENT:     Head: Normocephalic and atraumatic.  Eyes:     Conjunctiva/sclera: Conjunctivae normal.   Cardiovascular:     Rate and Rhythm: Normal rate.  Pulmonary:     Effort: Pulmonary effort is normal. No respiratory distress.  Musculoskeletal:        General: Normal range of motion.     Cervical back: Normal range of motion.     Comments: Right lower extremity with no swelling warmth erythema or edema.  Minimal right knee effusion of the right knee with compared to the left.  He has normal active knee range of motion with no discomfort.  He has positive medial McMurray's test on the right knee.  Normal hip internal/external rotation with no discomfort.  No swelling or edema in the lower leg.  Negative Homans' sign.  Knee stable to valgus and varus stress testing.  Tender along the medial joint line nontender along the lateral joint line.  No Baker's cyst noted.  Neurovascular intact in right lower extremity  Skin:    General: Skin is warm.     Findings: No rash.  Neurological:     Mental Status: He is alert and oriented to person, place, and time.  Psychiatric:        Behavior: Behavior normal.        Thought Content: Thought content normal.     ED Results / Procedures / Treatments  Labs (all labs ordered are listed, but only abnormal results are displayed) Labs Reviewed - No data to display  EKG None  Radiology DG Knee Complete 4 Views Right  Result Date: 03/20/2022 CLINICAL DATA:  Right knee pain. EXAM: RIGHT KNEE - COMPLETE 4+ VIEW COMPARISON:  None Available. FINDINGS: No fracture. No subluxation or dislocation. No worrisome lytic or sclerotic osseous abnormality. Small joint effusion evident. IMPRESSION: Small joint effusion.  No acute bony abnormality. Electronically Signed   By: Misty Stanley M.D.   On: 03/20/2022 10:40    Procedures Procedures    Medications Ordered in ED Medications - No data to display  ED Course/ Medical Decision Making/ A&P                             Medical Decision Making Amount and/or Complexity of Data Reviewed Radiology:  ordered.   67 year old male with right knee pain.  No known trauma or injury.  X-rays of the right knee ordered and independently reviewed by me today show no significant arthropathy.  Mild effusion noted.  Patient has symptoms consistent with mechanical knee pain, suspect possible medial meniscal tear.  No ligamentous instability.  No warmth, redness or fevers to suspect any type of infectious process or gout.  No swelling or edema in the lower leg nor tenderness to suspect DVT.  Patient placed on a prednisone taper and will follow-up with orthopedics. Final Clinical Impression(s) / ED Diagnoses Final diagnoses:  None    Rx / DC Orders ED Discharge Orders     None         Renata Caprice 03/20/22 1052    Carrie Mew, MD 03/21/22 1842

## 2022-03-20 NOTE — ED Triage Notes (Signed)
Pt reports has had knee pain to his right knee for several weeks. 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 Does not wish to file WC at this time.

## 2022-03-31 DIAGNOSIS — M1711 Unilateral primary osteoarthritis, right knee: Secondary | ICD-10-CM | POA: Diagnosis not present

## 2022-03-31 DIAGNOSIS — M2391 Unspecified internal derangement of right knee: Secondary | ICD-10-CM | POA: Diagnosis not present

## 2022-06-01 ENCOUNTER — Ambulatory Visit: Payer: No Typology Code available for payment source | Admitting: Family

## 2022-06-29 ENCOUNTER — Encounter: Payer: Self-pay | Admitting: Family

## 2022-06-29 ENCOUNTER — Ambulatory Visit: Payer: No Typology Code available for payment source | Admitting: Family

## 2022-06-29 VITALS — BP 118/78 | HR 86 | Temp 97.6°F | Ht 65.0 in | Wt 169.4 lb

## 2022-06-29 DIAGNOSIS — Z1159 Encounter for screening for other viral diseases: Secondary | ICD-10-CM | POA: Diagnosis not present

## 2022-06-29 DIAGNOSIS — J029 Acute pharyngitis, unspecified: Secondary | ICD-10-CM

## 2022-06-29 DIAGNOSIS — M1711 Unilateral primary osteoarthritis, right knee: Secondary | ICD-10-CM | POA: Diagnosis not present

## 2022-06-29 DIAGNOSIS — I159 Secondary hypertension, unspecified: Secondary | ICD-10-CM | POA: Diagnosis not present

## 2022-06-29 DIAGNOSIS — E785 Hyperlipidemia, unspecified: Secondary | ICD-10-CM | POA: Diagnosis not present

## 2022-06-29 DIAGNOSIS — Z23 Encounter for immunization: Secondary | ICD-10-CM | POA: Diagnosis not present

## 2022-06-29 DIAGNOSIS — Z0000 Encounter for general adult medical examination without abnormal findings: Secondary | ICD-10-CM | POA: Insufficient documentation

## 2022-06-29 DIAGNOSIS — Z136 Encounter for screening for cardiovascular disorders: Secondary | ICD-10-CM | POA: Diagnosis not present

## 2022-06-29 DIAGNOSIS — Z1211 Encounter for screening for malignant neoplasm of colon: Secondary | ICD-10-CM

## 2022-06-29 DIAGNOSIS — Z125 Encounter for screening for malignant neoplasm of prostate: Secondary | ICD-10-CM | POA: Diagnosis not present

## 2022-06-29 DIAGNOSIS — Z87891 Personal history of nicotine dependence: Secondary | ICD-10-CM | POA: Diagnosis not present

## 2022-06-29 DIAGNOSIS — R053 Chronic cough: Secondary | ICD-10-CM

## 2022-06-29 DIAGNOSIS — I10 Essential (primary) hypertension: Secondary | ICD-10-CM | POA: Insufficient documentation

## 2022-06-29 DIAGNOSIS — Z0289 Encounter for other administrative examinations: Secondary | ICD-10-CM | POA: Insufficient documentation

## 2022-06-29 LAB — LIPID PANEL
Cholesterol: 142 mg/dL (ref 0–200)
HDL: 48.4 mg/dL (ref >39.00)
LDL Cholesterol: 77 mg/dL (ref 0–99)
NonHDL: 93.63
Total CHOL/HDL Ratio: 3
Triglycerides: 82 mg/dL (ref 0.0–149.0)
VLDL: 16.4 mg/dL (ref 0.0–40.0)

## 2022-06-29 LAB — COMPREHENSIVE METABOLIC PANEL
ALT: 14 U/L (ref 0–53)
AST: 17 U/L (ref 0–37)
Albumin: 4.2 g/dL (ref 3.5–5.2)
Alkaline Phosphatase: 60 U/L (ref 39–117)
BUN: 7 mg/dL (ref 6–23)
CO2: 28 mEq/L (ref 19–32)
Calcium: 9.6 mg/dL (ref 8.4–10.5)
Chloride: 104 mEq/L (ref 96–112)
Creatinine, Ser: 1.19 mg/dL (ref 0.40–1.50)
GFR: 62.84 mL/min (ref >60.00)
Glucose, Bld: 91 mg/dL (ref 70–99)
Potassium: 4.1 mEq/L (ref 3.5–5.1)
Sodium: 140 mEq/L (ref 135–145)
Total Bilirubin: 0.4 mg/dL (ref 0.2–1.2)
Total Protein: 7.7 g/dL (ref 6.0–8.3)

## 2022-06-29 LAB — CBC WITH DIFFERENTIAL/PLATELET
Basophils Absolute: 0 10*3/uL (ref 0.0–0.1)
Basophils Relative: 0.3 % (ref 0.0–3.0)
Eosinophils Absolute: 0.2 10*3/uL (ref 0.0–0.7)
Eosinophils Relative: 3.1 % (ref 0.0–5.0)
HCT: 44.5 % (ref 39.0–52.0)
Hemoglobin: 14.4 g/dL (ref 13.0–17.0)
Lymphocytes Relative: 30 % (ref 12.0–46.0)
Lymphs Abs: 2.1 10*3/uL (ref 0.7–4.0)
MCHC: 32.5 g/dL (ref 30.0–36.0)
MCV: 89.9 fl (ref 78.0–100.0)
Monocytes Absolute: 0.8 10*3/uL (ref 0.1–1.0)
Monocytes Relative: 10.9 % (ref 3.0–12.0)
Neutro Abs: 3.9 10*3/uL (ref 1.4–7.7)
Neutrophils Relative %: 55.7 % (ref 43.0–77.0)
Platelets: 231 10*3/uL (ref 150.0–400.0)
RBC: 4.95 Mil/uL (ref 4.22–5.81)
RDW: 14.1 % (ref 11.5–15.5)
WBC: 7 10*3/uL (ref 4.0–10.5)

## 2022-06-29 LAB — POCT RAPID STREP A (OFFICE): Rapid Strep A Screen: NEGATIVE

## 2022-06-29 LAB — TSH: TSH: 0.7 u[IU]/mL (ref 0.35–5.50)

## 2022-06-29 LAB — PSA: PSA: 1.22 ng/mL (ref 0.10–4.00)

## 2022-06-29 MED ORDER — LORATADINE 10 MG PO TABS
10.0000 mg | ORAL_TABLET | Freq: Every day | ORAL | 1 refills | Status: DC
Start: 2022-06-29 — End: 2022-06-29

## 2022-06-29 MED ORDER — LORATADINE 10 MG PO TABS
10.0000 mg | ORAL_TABLET | Freq: Every day | ORAL | 1 refills | Status: DC
Start: 2022-06-29 — End: 2022-12-19

## 2022-06-29 MED ORDER — BUDESONIDE-FORMOTEROL FUMARATE 80-4.5 MCG/ACT IN AERO
2.0000 | INHALATION_SPRAY | Freq: Two times a day (BID) | RESPIRATORY_TRACT | 12 refills | Status: DC
Start: 2022-06-29 — End: 2022-06-29

## 2022-06-29 MED ORDER — BUDESONIDE-FORMOTEROL FUMARATE 80-4.5 MCG/ACT IN AERO
2.0000 | INHALATION_SPRAY | Freq: Two times a day (BID) | RESPIRATORY_TRACT | 6 refills | Status: DC
Start: 2022-06-29 — End: 2022-12-28

## 2022-06-29 NOTE — Patient Instructions (Addendum)
I have placed a referral to pulmonology to start the CT lung cancer screening program.  This is an annual test for former smokers.  I have also placed an order for an ultrasound of your abdomen and also colonoscopy Let us know if you dont hear back within a week in regards to an appointment being scheduled.   You may use albuterol which considered a rescue inhaler for increased shortness of breath or wheezing.  I have sent in a daily maintenance inhaler which has a low-dose steroid called Symbicort.  This is to be used every day to decrease symptoms.  Also like for you to start Claritin 10 mg daily.   Nice to meet you!

## 2022-06-29 NOTE — Assessment & Plan Note (Signed)
Differential includes COPD based on years of smoking.  CT chest 04/23/2021 at [facility] unchanged 5 mm right lower lobe nodule. Strep negative today.  Consider formal evaluation with pulmonology.  Patient has longstanding history of smoking, referral to CT lung cancer screening.  I have also ordered AAA screening.  Discussed use of daily inhaler, albuterol.  We agreed to start daily preventative inhaler Symbicort and claritin.  Consider GERD, ACE cough, CXR , echocardiogram, and pulmonology referral if cough is not improved.

## 2022-06-29 NOTE — Assessment & Plan Note (Signed)
Chronic, stable.  Continue amlodipine-benazepril 10-20 mg qd.

## 2022-06-29 NOTE — Progress Notes (Signed)
Assessment & Plan:  Chronic cough Assessment & Plan: Differential includes COPD based on years of smoking.  CT chest 04/23/2021 at [facility] unchanged 5 mm right lower lobe nodule. Strep negative today.  Consider formal evaluation with pulmonology.  Patient has longstanding history of smoking, referral to CT lung cancer screening.  I have also ordered AAA screening.  Discussed use of daily inhaler, albuterol.  We agreed to start daily preventative inhaler Symbicort and claritin.  Consider GERD, ACE cough, CXR , echocardiogram, and pulmonology referral if cough is not improved.  Orders: -     Budesonide-Formoterol Fumarate; Inhale 2 puffs into the lungs 2 (two) times daily.  Dispense: 2 each; Refill: 6 -     Loratadine; Take 1 tablet (10 mg total) by mouth daily.  Dispense: 90 tablet; Refill: 1  Encounter for medical examination to establish care  Hyperlipidemia, unspecified hyperlipidemia type -     CBC with Differential/Platelet -     Comprehensive metabolic panel -     TSH -     Lipid panel  Former smoker -     Ambulatory Referral for Lung Cancer Scre -     US AORTA DUPLEX LIMITED; Future  Encounter for hepatitis C screening test for low risk patient -     Hepatitis C antibody  Screen for colon cancer -     Ambulatory referral to Gastroenterology  Encounter for abdominal aortic aneurysm (AAA) screening  Encounter for immunization  Screening for prostate cancer -     PSA  Secondary hypertension Assessment & Plan: Chronic, stable.  Continue amlodipine-benazepril 10-20 mg qd.    Sore throat -     POCT rapid strep A  Need for pneumococcal 20-valent conjugate vaccination -     Pneumococcal conjugate vaccine 20-valent     Return precautions given.   Risks, benefits, and alternatives of the medications and treatment plan prescribed today were discussed, and patient expressed understanding.   Education regarding symptom management and diagnosis given to patient on  AVS either electronically or printed.  Return in about 2 weeks (around 07/13/2022) for Medicare Wellness upcoming or due, schedule.  Rennie Plowman, FNP  Subjective:    Patient ID: Brian Forbes, Brian Forbes    DOB: 27-Apr-1955, 67 y.o.   MRN: [id number]  CC: Brian Forbes is a 67 y.o. Brian Forbes who presents today to establish care, transfer from Dr Vinson Moselle.    HPI: Complains of sore throat for a few weeks.  Worse in the morning Coughs some at night.  Endorses occasional wheezing.  He uses albuterol with temporary relief.  No fever, sob, chills,  CP, sinus congestion, ear pain, epigastric burning.  Compliant prilosec.         Former smoker.  Colonoscopy was approx 5 years ago at [facility], last done 03/19/2018 Repeat colonoscopy in 1 year for surveillance given poor prep and number of polyps.   Due pcv20  CT chest 04/23/2021-revealed unchanged 5 mm right lower lobe nodule  Allergies: Patient has no known allergies. Current Outpatient Medications on File Prior to Visit  Medication Sig Dispense Refill   albuterol (VENTOLIN HFA) 108 (90 Base) MCG/ACT inhaler Inhale 1-2 puffs into the lungs every 6 (six) hours as needed for wheezing or shortness of breath.     amLODipine-benazepril (LOTREL) 10-20 MG capsule Take 1 capsule by mouth every morning.     atorvastatin (LIPITOR) 10 MG tablet Take 10 mg by mouth every morning.     naproxen (NAPROSYN) 500 MG tablet  Take 1 tablet (500 mg total) by mouth 2 (two) times daily with a meal. 20 tablet 0   omeprazole (PRILOSEC) 20 MG capsule Take 20 mg by mouth every morning.     No current facility-administered medications on file prior to visit.    Review of Systems  Constitutional:  Negative for chills and fever.  HENT:  Positive for sore throat. Negative for congestion, ear pain and sinus pain.   Respiratory:  Positive for cough and wheezing. Negative for shortness of breath.   Cardiovascular:  Negative for chest pain, palpitations and leg  swelling.  Gastrointestinal:  Negative for nausea and vomiting.      Objective:    BP 118/78   Pulse 86   Temp 97.6 F (36.4 C) (Oral)   Ht 5\' 5"  (1.651 m)   Wt 169 lb 6.4 oz (76.8 kg)   SpO2 96%   BMI 28.19 kg/m  BP Readings from Last 3 Encounters:  06/29/22 118/78  03/20/22 115/65  08/24/21 (!) 150/90   Wt Readings from Last 3 Encounters:  06/29/22 169 lb 6.4 oz (76.8 kg)  03/20/22 175 lb (79.4 kg)  08/24/21 175 lb (79.4 kg)    Physical Exam Vitals reviewed.  Constitutional:      Appearance: He is well-developed.  HENT:     Head: Normocephalic and atraumatic.     Right Ear: Hearing, tympanic membrane, ear canal and external ear normal. No decreased hearing noted. No drainage, swelling or tenderness. No middle ear effusion. Tympanic membrane is not injected, erythematous or bulging.     Left Ear: Hearing, tympanic membrane, ear canal and external ear normal. No decreased hearing noted. No drainage, swelling or tenderness.  No middle ear effusion. Tympanic membrane is not injected, erythematous or bulging.     Nose: Nose normal.     Right Sinus: No maxillary sinus tenderness or frontal sinus tenderness.     Left Sinus: No maxillary sinus tenderness or frontal sinus tenderness.     Mouth/Throat:     Pharynx: Uvula midline. Posterior oropharyngeal erythema present. No oropharyngeal exudate.     Tonsils: No tonsillar abscesses.     Comments: Tonsils well behind pillars. Eyes:     Conjunctiva/sclera: Conjunctivae normal.  Cardiovascular:     Rate and Rhythm: Regular rhythm.     Heart sounds: Normal heart sounds.  Pulmonary:     Effort: Pulmonary effort is normal. No respiratory distress.     Breath sounds: Examination of the right-middle field reveals wheezing. Examination of the left-middle field reveals wheezing. Wheezing present. No rhonchi or rales.     Comments: Few expiratory wheezes on exam Lymphadenopathy:     Head:     Right side of head: No submental,  submandibular, tonsillar, preauricular, posterior auricular or occipital adenopathy.     Left side of head: No submental, submandibular, tonsillar, preauricular, posterior auricular or occipital adenopathy.     Cervical: No cervical adenopathy.  Skin:    General: Skin is warm and dry.  Neurological:     Mental Status: He is alert.  Psychiatric:        Speech: Speech normal.        Behavior: Behavior normal.

## 2022-06-30 LAB — HEPATITIS C ANTIBODY: Hepatitis C Ab: NONREACTIVE

## 2022-07-13 ENCOUNTER — Encounter: Payer: Self-pay | Admitting: Family

## 2022-07-13 ENCOUNTER — Telehealth: Payer: Self-pay | Admitting: Family

## 2022-07-13 ENCOUNTER — Ambulatory Visit (INDEPENDENT_AMBULATORY_CARE_PROVIDER_SITE_OTHER): Payer: No Typology Code available for payment source | Admitting: Family

## 2022-07-13 ENCOUNTER — Encounter: Payer: Self-pay | Admitting: *Deleted

## 2022-07-13 VITALS — BP 132/80 | HR 93 | Temp 98.1°F | Ht 65.0 in | Wt 170.0 lb

## 2022-07-13 DIAGNOSIS — R053 Chronic cough: Secondary | ICD-10-CM | POA: Diagnosis not present

## 2022-07-13 DIAGNOSIS — Z1211 Encounter for screening for malignant neoplasm of colon: Secondary | ICD-10-CM

## 2022-07-13 MED ORDER — AMOXICILLIN-POT CLAVULANATE 875-125 MG PO TABS
1.0000 | ORAL_TABLET | Freq: Two times a day (BID) | ORAL | 0 refills | Status: AC
Start: 2022-07-13 — End: 2022-07-20

## 2022-07-13 MED ORDER — FAMOTIDINE 20 MG PO TABS
20.0000 mg | ORAL_TABLET | Freq: Every evening | ORAL | 0 refills | Status: DC
Start: 2022-07-13 — End: 2023-03-01

## 2022-07-13 NOTE — Patient Instructions (Addendum)
I placed a referral to [facility] pulmonology  for annual lung cancer screening program.  You should receive a phone call from the office to discuss the test and to qualify you for the program.  If you do not receive a phone call from their office in the next 2 weeks, please [place] [facility] at : [phone]  If you have any issues in scheduling or speaking with pulmonology, please let me know  You are due for colonoscopy and likely upper endoscopy.   Please call [facility] to schedule.  [phone]  Start augmentin , antibiotic.   Ensure to take probiotics while on antibiotics and also for 2 weeks after completion. This can either be by eating yogurt daily or taking a probiotic supplement over the counter such as Culturelle.It is important to re-colonize the gut with good bacteria and also to prevent any diarrheal infections associated with antibiotic use.   Start Pepcid AC at nighttime for suspected reflux.  Please continue omeprazole 20 mg in the morning

## 2022-07-13 NOTE — Telephone Encounter (Signed)
Brian Forbes,   I saw this patient this morning and can see that you have tried to reach out to him.  I provided him with the number to [facility] so you may call to schedule.    I did want to ask you to make a note as patient has chronic cough, GERD, h/o smoking.  I would like for him to have an upper endoscopy in addition to colonoscopy.  I have placed a new referral which reflects this.

## 2022-07-13 NOTE — Assessment & Plan Note (Addendum)
Afebrile. No weight loss, lymphadenopathy on exam. Pending strep culture ( POC previously negative).  Start augmentin for about bacterial pharyngitis, right otitis media.  Continue Symbicort, omeprazole 20 mg every morning.  Start Pepcid 20 mg nightly.  Pending chest x-ray Consider CT head and neck if pharyngitis persist, particularly in the setting of smoking.  Patient declines ordering this test today.  He prefers to try the antibiotic first.  Advised probiotics.  Consider echocardiogram. Close follow up.  Of note: I have sent a note to [facility] in regards to patient scheduling colonoscopy as well as adding endoscopy in the setting of GERD, hoarseness, chronic cough, history of smoking.

## 2022-07-13 NOTE — Telephone Encounter (Signed)
Attempted to contact patient to complete triage and schedule office visit but his voice mail is currently full.   Thanks, [place], [place]

## 2022-07-13 NOTE — Progress Notes (Signed)
Assessment & Plan:  Chronic cough Assessment & Plan: Afebrile. No weight loss, lymphadenopathy on exam. Pending strep culture ( POC previously negative).  Start augmentin for about bacterial pharyngitis, right otitis media.  Continue Symbicort, omeprazole 20 mg every morning.  Start Pepcid 20 mg nightly.  Pending chest x-ray Consider CT head and neck if pharyngitis persist, particularly in the setting of smoking.  Patient declines ordering this test today.  He prefers to try the antibiotic first.  Advised probiotics.  Consider echocardiogram. Close follow up.  Of note: I have sent a note to [facility] in regards to patient scheduling colonoscopy as well as adding endoscopy in the setting of GERD, hoarseness, chronic cough, history of smoking.   Orders: -     Culture, Group A Strep -     Famotidine; Take 1 tablet (20 mg total) by mouth every evening.  Dispense: 90 tablet; Refill: 0 -     Amoxicillin-Pot Clavulanate; Take 1 tablet by mouth 2 (two) times daily for 7 days.  Dispense: 14 tablet; Refill: 0 -     DG Chest 2 View; Future -     Ambulatory referral to Gastroenterology  Screen for colon cancer -     Ambulatory referral to Gastroenterology     Return precautions given.   Risks, benefits, and alternatives of the medications and treatment plan prescribed today were discussed, and patient expressed understanding.   Education regarding symptom management and diagnosis given to patient on AVS either electronically or printed.  No follow-ups on file.  Rennie Plowman, FNP  Subjective:    Patient ID: Brian Forbes, male    DOB: Aug 27, 1955, 67 y.o.   MRN: [id number]  CC: Brian Forbes is a 67 y.o. male who presents today for follow up.   HPI: Complains of right sore throat x 3 weeks, unchanged.   Episode 4 days ago in which right sore throat radiated to the right ear causing pain, since right ear pain has resolved.    Cough is better with symbicort. Occasional dry  cough at nighttime when laying down.      Endorses hoarseness, ear pressure.   Compliant with omeprazole in the morning.   Denies tinnitus,  CP, sob, leg swelling, choking, fever, ear drainage, weight loss.   Started claritin without improvement .   Allergies: Patient has no known allergies. Current Outpatient Medications on File Prior to Visit  Medication Sig Dispense Refill   albuterol (VENTOLIN HFA) 108 (90 Base) MCG/ACT inhaler Inhale 1-2 puffs into the lungs every 6 (six) hours as needed for wheezing or shortness of breath.     amLODipine-benazepril (LOTREL) 10-20 MG capsule Take 1 capsule by mouth every morning.     atorvastatin (LIPITOR) 10 MG tablet Take 10 mg by mouth every morning.     budesonide-formoterol (SYMBICORT) 80-4.5 MCG/ACT inhaler Inhale 2 puffs into the lungs 2 (two) times daily. 2 each 6   loratadine (CLARITIN) 10 MG tablet Take 1 tablet (10 mg total) by mouth daily. 90 tablet 1   naproxen (NAPROSYN) 500 MG tablet Take 1 tablet (500 mg total) by mouth 2 (two) times daily with a meal. 20 tablet 0   omeprazole (PRILOSEC) 20 MG capsule Take 20 mg by mouth every morning.     No current facility-administered medications on file prior to visit.    Review of Systems  Constitutional:  Negative for chills and fever.  HENT:  Positive for ear pain (resolved) and voice change. Negative for congestion,  hearing loss, sinus pressure and trouble swallowing.   Respiratory:  Positive for cough. Negative for shortness of breath and wheezing.   Cardiovascular:  Negative for chest pain and palpitations.  Gastrointestinal:  Negative for nausea and vomiting.      Objective:    BP 132/80   Pulse 93   Temp 98.1 F (36.7 C) (Oral)   Ht 5\' 5"  (1.651 m)   Wt 170 lb (77.1 kg)   SpO2 96%   BMI 28.29 kg/m  BP Readings from Last 3 Encounters:  07/13/22 132/80  06/29/22 118/78  03/20/22 115/65   Wt Readings from Last 3 Encounters:  07/13/22 170 lb (77.1 kg)  06/29/22 169  lb 6.4 oz (76.8 kg)  03/20/22 175 lb (79.4 kg)    Physical Exam Vitals reviewed.  Constitutional:      Appearance: He is well-developed.  HENT:     Head: Normocephalic and atraumatic.     Jaw: No trismus.     Right Ear: Hearing, tympanic membrane, ear canal and external ear normal. No decreased hearing noted. No drainage, swelling or tenderness. No middle ear effusion. Tympanic membrane is not injected, erythematous or bulging.     Left Ear: Hearing, tympanic membrane, ear canal and external ear normal. No decreased hearing noted. No drainage, swelling or tenderness.  No middle ear effusion. Tympanic membrane is not injected, erythematous or bulging.     Nose: Nose normal.     Right Sinus: No maxillary sinus tenderness or frontal sinus tenderness.     Left Sinus: No maxillary sinus tenderness or frontal sinus tenderness.     Mouth/Throat:     Pharynx: Uvula midline. Posterior oropharyngeal erythema present. No pharyngeal swelling or oropharyngeal exudate.     Tonsils: No tonsillar abscesses.  Eyes:     Conjunctiva/sclera: Conjunctivae normal.  Cardiovascular:     Rate and Rhythm: Regular rhythm.     Heart sounds: Normal heart sounds.  Pulmonary:     Effort: Pulmonary effort is normal. No respiratory distress.     Breath sounds: Normal breath sounds. No wheezing, rhonchi or rales.  Lymphadenopathy:     Head:     Right side of head: No submental, submandibular, tonsillar, preauricular, posterior auricular or occipital adenopathy.     Left side of head: No submental, submandibular, tonsillar, preauricular, posterior auricular or occipital adenopathy.     Cervical: No cervical adenopathy.  Skin:    General: Skin is warm and dry.  Neurological:     Mental Status: He is alert.  Psychiatric:        Speech: Speech normal.        Behavior: Behavior normal.

## 2022-07-15 LAB — CULTURE, GROUP A STREP
MICRO NUMBER:: 15044616
SPECIMEN QUALITY:: ADEQUATE

## 2022-07-20 ENCOUNTER — Telehealth: Payer: Self-pay | Admitting: Family

## 2022-07-20 NOTE — Telephone Encounter (Signed)
I called pt with his appt on 06/20 arrive at 10:45 at the [facility]. No vm

## 2022-07-25 NOTE — Telephone Encounter (Signed)
LVM to call back to   advise that [facility] has been trying to reach him to schedule colonoscopy and endoscopy.  Please provide phone number and advise  him to call   Letter mailed on 07/25/2022

## 2022-07-25 NOTE — Telephone Encounter (Signed)
Noted  Brian Forbes team   Please call pt and advise that [facility] has been trying to reach him to schedule colonoscopy and endoscopy.  Please provide phone number and advise  him to call  Mail letter per protocol if he doesn't answer

## 2022-07-28 ENCOUNTER — Ambulatory Visit: Payer: No Typology Code available for payment source | Attending: Family

## 2022-07-28 ENCOUNTER — Ambulatory Visit: Payer: No Typology Code available for payment source

## 2022-08-02 ENCOUNTER — Ambulatory Visit: Payer: No Typology Code available for payment source

## 2022-08-06 NOTE — Patient Instructions (Signed)

## 2022-08-09 ENCOUNTER — Ambulatory Visit (INDEPENDENT_AMBULATORY_CARE_PROVIDER_SITE_OTHER): Payer: Self-pay | Admitting: Nurse Practitioner

## 2022-08-09 ENCOUNTER — Encounter: Payer: Self-pay | Admitting: Nurse Practitioner

## 2022-08-09 VITALS — BP 135/80 | HR 69 | Temp 98.0°F | Ht 65.0 in | Wt 171.8 lb

## 2022-08-09 DIAGNOSIS — Z0289 Encounter for other administrative examinations: Secondary | ICD-10-CM

## 2022-08-09 NOTE — Progress Notes (Signed)
BP 135/80   Pulse 69   Temp 98 F (36.7 C) (Oral)   Ht 5\' 5"  (1.651 m)   Wt 171 lb 12.8 oz (77.9 kg)   SpO2 99%   BMI 28.59 kg/m    Subjective:    Patient ID: Brian Forbes, male    DOB: May 07, 1955, 67 y.o.   MRN: [id number]  HPI: Brian Forbes is a 67 y.o. 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  Previous DOT physical was on 08/18/21.  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  Past Medical History:  Past Medical History:  Diagnosis Date   Arthritis    Asthma    GERD (gastroesophageal reflux disease)    Hyperlipidemia    Hypertension     Surgical History:  Past Surgical History:  Procedure Laterality Date   COLONOSCOPY     SHOULDER ARTHROSCOPY WITH SUBACROMIAL DECOMPRESSION, ROTATOR CUFF REPAIR AND BICEP TENDON REPAIR Right 11/10/2020   Procedure: SHOULDER ARTHROSCOPY WITH DEBRIDEMENT, DECOMPRESSION, PARTIAL THICKNESS ROTATOR CUFF REPAIR AND BICEP TENODESIS;  Surgeon: Christena Flake, MD;  Location: [facility] ORS;  Service: Orthopedics;  Laterality: Right;    Medications:  Current Outpatient Medications on File Prior to Visit  Medication Sig   albuterol (VENTOLIN HFA) 108 (90 Base) MCG/ACT inhaler Inhale 1-2 puffs into the lungs every 6 (six) hours as needed for wheezing or shortness of breath.   amLODipine-benazepril (LOTREL) 10-20 MG capsule Take 1 capsule by mouth every morning.   atorvastatin (LIPITOR) 10 MG tablet Take 10 mg by mouth every morning.   budesonide-formoterol (SYMBICORT) 80-4.5 MCG/ACT inhaler Inhale 2 puffs into the lungs 2 (two) times daily.   famotidine (PEPCID) 20 MG tablet Take 1 tablet (20 mg total) by mouth every evening.   loratadine (CLARITIN) 10 MG tablet Take 1 tablet (10 mg total) by mouth daily.   naproxen (NAPROSYN) 500 MG tablet Take 1 tablet (500 mg total) by mouth 2 (two) times daily with a meal.   omeprazole (PRILOSEC) 20 MG capsule Take 20 mg by mouth every morning.   No current  facility-administered medications on file prior to visit.    Allergies:  No Known Allergies  Social History:  Social History   Socioeconomic History   Marital status: Married    Spouse name: Not on file   Number of children: Not on file   Years of education: Not on file   Highest education level: Not on file  Occupational History   Not on file  Tobacco Use   Smoking status: Former    Packs/day: 0.25    Years: 10.00    Additional pack years: 0.00    Total pack years: 2.50    Types: Cigarettes    Quit date: 02/08/2019    Years since quitting: 3.5   Smokeless tobacco: Never  Vaping Use   Vaping Use: Never used  Substance and Sexual Activity   Alcohol use: Yes    Comment: occ beer   Drug use: Never   Sexual activity: Not on file  Other Topics Concern   Not on file  Social History Narrative   From [place], [place]   He is family of 7 siblings total      Moved to his area due to his daughter   2 daughters - 79, 76   8 grandchildren, and 8 great grandchildren      Married , 49 years.   Retired from [employer]   Works part time  at [employer] which he enjoys.    Social Determinants of Health   Financial Resource Strain: Not on file  Food Insecurity: Not on file  Transportation Needs: Not on file  Physical Activity: Not on file  Stress: Not on file  Social Connections: Not on file  Intimate Partner Violence: Not on file   Social History   Tobacco Use  Smoking Status Former   Packs/day: 0.25   Years: 10.00   Additional pack years: 0.00   Total pack years: 2.50   Types: Cigarettes   Quit date: 02/08/2019   Years since quitting: 3.5  Smokeless Tobacco Never   Social History   Substance and Sexual Activity  Alcohol Use Yes   Comment: occ beer    Family History:  Family History  Problem Relation Age of Onset   Stomach cancer Mother 48   Pancreatitis Father 95   Heart attack Sister 60   Colon cancer Neg Hx    Prostate cancer Neg Hx     Past medical  history, surgical history, medications, allergies, family history and social history reviewed with patient today and changes made to appropriate areas of the chart.   ROS All other ROS negative except what is listed above and in the HPI.      Objective:    BP 135/80   Pulse 69   Temp 98 F (36.7 C) (Oral)   Ht 5\' 5"  (1.651 m)   Wt 171 lb 12.8 oz (77.9 kg)   SpO2 99%   BMI 28.59 kg/m   Wt Readings from Last 3 Encounters:  08/09/22 171 lb 12.8 oz (77.9 kg)  07/13/22 170 lb (77.1 kg)  06/29/22 169 lb 6.4 oz (76.8 kg)    Physical Exam Vitals and nursing note reviewed.  Constitutional:      General: He is awake. He is not in acute distress.    Appearance: He is well-developed and well-groomed. He is not ill-appearing or toxic-appearing.  HENT:     Head: Normocephalic and atraumatic.     Right Ear: Hearing, tympanic membrane, ear canal and external ear normal. No drainage.     Left Ear: Hearing, tympanic membrane, ear canal and external ear normal. No drainage.     Nose: Nose normal.     Mouth/Throat:     Pharynx: Uvula midline.  Eyes:     General: Lids are normal.        Right eye: No discharge.        Left eye: No discharge.     Extraocular Movements: Extraocular movements intact.     Conjunctiva/sclera: Conjunctivae normal.     Pupils: Pupils are equal, round, and reactive to light.     Visual Fields: Right eye visual fields normal and left eye visual fields normal.  Neck:     Thyroid: No thyromegaly.     Vascular: No carotid bruit or JVD.     Trachea: Trachea normal.  Cardiovascular:     Rate and Rhythm: Normal rate and regular rhythm.     Heart sounds: Normal heart sounds, S1 normal and S2 normal. No murmur heard.    No gallop.  Pulmonary:     Effort: Pulmonary effort is normal. No accessory muscle usage or respiratory distress.     Breath sounds: Normal breath sounds.  Abdominal:     General: Bowel sounds are normal.     Palpations: Abdomen is soft. There is no  hepatomegaly or splenomegaly.     Tenderness: There is  no abdominal tenderness.  Musculoskeletal:        General: Normal range of motion.     Cervical back: Normal range of motion and neck supple.     Right lower leg: No edema.     Left lower leg: No edema.  Lymphadenopathy:     Head:     Right side of head: No submental, submandibular, tonsillar, preauricular or posterior auricular adenopathy.     Left side of head: No submental, submandibular, tonsillar, preauricular or posterior auricular adenopathy.     Cervical: No cervical adenopathy.  Skin:    General: Skin is warm and dry.     Capillary Refill: Capillary refill takes less than 2 seconds.     Findings: No rash.  Neurological:     Mental Status: He is alert and oriented to person, place, and time.     Gait: Gait is intact.     Deep Tendon Reflexes: Reflexes are normal and symmetric.     Reflex Scores:      Brachioradialis reflexes are 2+ on the right side and 2+ on the left side.      Patellar reflexes are 2+ on the right side and 2+ on the left side. Psychiatric:        Attention and Perception: Attention normal.        Mood and Affect: Mood normal.        Speech: Speech normal.        Behavior: Behavior normal. Behavior is cooperative.        Thought Content: Thought content normal.        Cognition and Memory: Cognition normal.     Results for orders placed or performed in visit on 07/13/22  Culture, Group A Strep   Specimen: Throat  Result Value Ref Range   MICRO NUMBER: [id number]    SPECIMEN QUALITY: Adequate    SOURCE: THROAT    STATUS: FINAL    RESULT: No group A Streptococcus isolated       Assessment & Plan:   Problem List Items Addressed This Visit   None Visit Diagnoses     Encounter for examination required by Department of Transportation (DOT)    -  Primary   DOT physical today with overall healthy exam.  Forms completed and provided to patient for one year DOT.        Follow up plan: Return  if symptoms worsen or fail to improve.   PATIENT COUNSELING:    Advised to avoid cigarette smoking.  I discussed with the patient that most people either abstain from alcohol or drink within safe limits (<=14/week and <=4 drinks/occasion for males, <=7/weeks and <= 3 drinks/occasion for females) and that the risk for alcohol disorders and other health effects rises proportionally with the number of drinks per week and how often a drinker exceeds daily limits.  Discussed cessation/primary prevention of drug use and availability of treatment for abuse.   Diet: Encouraged to adjust caloric intake to maintain  or achieve ideal body weight, to reduce intake of dietary saturated fat and total fat, to limit sodium intake by avoiding high sodium foods and not adding table salt, and to maintain adequate dietary potassium and calcium preferably from fresh fruits, vegetables, and low-fat dairy products.    Stressed the importance of regular exercise  Injury prevention: Discussed safety belts, safety helmets, smoke detector, smoking near bedding or upholstery.   Dental health: Discussed importance of regular tooth brushing, flossing, and dental visits.  NEXT PREVENTATIVE PHYSICAL DUE IN 1 YEAR. Return if symptoms worsen or fail to improve.

## 2022-09-05 ENCOUNTER — Telehealth: Payer: Self-pay | Admitting: Family

## 2022-09-05 NOTE — Telephone Encounter (Signed)
Copied from CRM [id number]. Topic: Medicare AWV >> Sep 05, 2022  3:05 PM Payton Doughty wrote: Reason for CRM: Called 09/05/22 to r/s time from 3pm to 4pm. Please confirm AWV time change.[facility]  Verlee Rossetti; Care Guide Ambulatory Clinical Support [place] [facility] Direct Dial: [phone]

## 2022-09-12 ENCOUNTER — Ambulatory Visit: Payer: No Typology Code available for payment source | Admitting: Family Medicine

## 2022-09-13 ENCOUNTER — Ambulatory Visit: Payer: No Typology Code available for payment source

## 2022-10-19 ENCOUNTER — Ambulatory Visit: Payer: No Typology Code available for payment source | Admitting: Family Medicine

## 2022-11-18 ENCOUNTER — Other Ambulatory Visit: Payer: Self-pay

## 2022-11-18 ENCOUNTER — Telehealth: Payer: Self-pay | Admitting: Family

## 2022-11-18 MED ORDER — ATORVASTATIN CALCIUM 10 MG PO TABS: 10.0000 mg | ORAL_TABLET | Freq: Every morning | ORAL | 2 refills | Status: DC

## 2022-11-18 NOTE — Telephone Encounter (Signed)
Patient dropped in asking for a med refill on  atorvastatin (LIPITOR) 10 MG tablet   Please send to [facility] here in [place].

## 2022-11-18 NOTE — Telephone Encounter (Signed)
Rx sent 

## 2022-12-19 ENCOUNTER — Other Ambulatory Visit: Payer: Self-pay | Admitting: Family

## 2022-12-19 DIAGNOSIS — R053 Chronic cough: Secondary | ICD-10-CM

## 2022-12-28 ENCOUNTER — Telehealth: Payer: Self-pay | Admitting: Family

## 2022-12-28 ENCOUNTER — Encounter: Payer: Self-pay | Admitting: Family

## 2022-12-28 ENCOUNTER — Ambulatory Visit: Payer: No Typology Code available for payment source | Admitting: Family

## 2022-12-28 VITALS — BP 130/80 | HR 94 | Temp 98.6°F | Ht 65.0 in | Wt 166.2 lb

## 2022-12-28 DIAGNOSIS — Z1211 Encounter for screening for malignant neoplasm of colon: Secondary | ICD-10-CM

## 2022-12-28 DIAGNOSIS — R053 Chronic cough: Secondary | ICD-10-CM

## 2022-12-28 DIAGNOSIS — J4530 Mild persistent asthma, uncomplicated: Secondary | ICD-10-CM

## 2022-12-28 MED ORDER — PANTOPRAZOLE SODIUM 20 MG PO TBEC: 40.0000 mg | DELAYED_RELEASE_TABLET | ORAL | 1 refills | Status: DC

## 2022-12-28 MED ORDER — FLUTICASONE-SALMETEROL 100-50 MCG/ACT IN AEPB: 1.0000 | INHALATION_SPRAY | Freq: Two times a day (BID) | RESPIRATORY_TRACT | 3 refills | Status: DC

## 2022-12-28 NOTE — Patient Instructions (Addendum)
As discussed today, please call to make an appointment for Annual Lung cancer screen  With CT Chest:  [phone]. Let me know if any issues in doing so. You are also overdue for your screening colonoscopy to screen for colon cancer.  [facility] has reach out to you to schedule.  Please return their call at [phone]  Stop omeprazole.  Start Protonix 40 mg taken in the morning prior to breakfast or coffee. Please continue Pepcid AC 20 mg at bedtime.  Trial of a new inhaler called Advair.  Please let us know if this is effective.

## 2022-12-28 NOTE — Progress Notes (Unsigned)
   Assessment & Plan:  Screening for colon cancer -     Ambulatory referral to Gastroenterology     Return precautions given.   Risks, benefits, and alternatives of the medications and treatment plan prescribed today were discussed, and patient expressed understanding.   Education regarding symptom management and diagnosis given to patient on AVS either electronically or printed.  No follow-ups on file.  Rennie Plowman, FNP  Subjective:    Patient ID: Brian Forbes, male    DOB: [birth year], 67 y.o.   MRN: [id number]  CC: Brian Forbes is a 67 y.o. male who presents today for an acute visit.    HPI: Complains of dry cough x 2 months.  Cough had resolved 5 months ago after qugmentin, since recurred   Occurs at 1-2 am during the night.   No SOB, CP, wheezing, PND, sinus pain, unsuall weight loss, pain with swalloing, episgastric burning, leg swelling, orthopnea.     H/o asthma , GERD  Compliant with albutoerl and using once daily.   He is using Symbicort 80-4.5 due to cost.    He is also compliant with Pepcid 20 mg nightly, omeprazole 20 mg every day Former smoker, quit 3 years ago   Allergies: Patient has no known allergies. Current Outpatient Medications on File Prior to Visit  Medication Sig Dispense Refill   albuterol (VENTOLIN HFA) 108 (90 Base) MCG/ACT inhaler Inhale 1-2 puffs into the lungs every 6 (six) hours as needed for wheezing or shortness of breath.     amLODipine-benazepril (LOTREL) 10-20 MG capsule Take 1 capsule by mouth every morning.     atorvastatin (LIPITOR) 10 MG tablet Take 1 tablet (10 mg total) by mouth every morning. 90 tablet 2   budesonide-formoterol (SYMBICORT) 80-4.5 MCG/ACT inhaler Inhale 2 puffs into the lungs 2 (two) times daily. 2 each 6   famotidine (PEPCID) 20 MG tablet Take 1 tablet (20 mg total) by mouth every evening. 90 tablet 0   loratadine (CLARITIN) 10 MG tablet Take 1 tablet by mouth once daily 90 tablet 0    naproxen (NAPROSYN) 500 MG tablet Take 1 tablet (500 mg total) by mouth 2 (two) times daily with a meal. 20 tablet 0   omeprazole (PRILOSEC) 20 MG capsule Take 20 mg by mouth every morning.     No current facility-administered medications on file prior to visit.    Review of Systems    Objective:    BP 130/80   Pulse 94   Temp 98.6 F (37 C) (Oral)   Ht 5\' 5"  (1.651 m)   Wt 166 lb 3.2 oz (75.4 kg)   SpO2 98%   BMI 27.66 kg/m   BP Readings from Last 3 Encounters:  12/28/22 130/80  08/09/22 135/80  07/13/22 132/80   Wt Readings from Last 3 Encounters:  12/28/22 166 lb 3.2 oz (75.4 kg)  08/09/22 171 lb 12.8 oz (77.9 kg)  07/13/22 170 lb (77.1 kg)    Physical Exam

## 2022-12-28 NOTE — Telephone Encounter (Signed)
Call [facility]  Per patient, symbicort is not on formulary. I have tried advair.    What other inhalers are on his formulary?   He reports having flu and rsv vaccine, and also covid   Please abstract to chart

## 2022-12-29 ENCOUNTER — Other Ambulatory Visit: Payer: No Typology Code available for payment source

## 2022-12-29 ENCOUNTER — Ambulatory Visit: Payer: No Typology Code available for payment source

## 2022-12-29 DIAGNOSIS — R053 Chronic cough: Secondary | ICD-10-CM | POA: Diagnosis not present

## 2022-12-29 NOTE — Assessment & Plan Note (Addendum)
Concern for concomitant asthma, COPD. Start low dose ICS-LABA with Advair. Pending CXR.  Previously referred him for CT lung cancer screening program.  Reiterated the importance of this particular in the setting of longstanding smoking, chronic cough; provided him phone number today to call to schedule.  Likely he will require pulmonary consult for PFTs.  For now, also advised to continue Protonix 20 mg daily, Claritin 10 mg daily to treat for potential aggravating allergic rhinitis, GERD.

## 2022-12-29 NOTE — Telephone Encounter (Signed)
Was unable to lvm ,vm was full  Per patient, symbicort is not on formulary. I have tried advair.      What other inhalers are on his formulary?    He reports having flu and rsv vaccine, and also covid

## 2023-01-02 ENCOUNTER — Other Ambulatory Visit: Payer: Self-pay | Admitting: Family

## 2023-01-02 DIAGNOSIS — R053 Chronic cough: Secondary | ICD-10-CM

## 2023-01-02 DIAGNOSIS — Z87891 Personal history of nicotine dependence: Secondary | ICD-10-CM

## 2023-01-02 NOTE — Telephone Encounter (Signed)
Patient called and said he came in one day last week for a cough. He said he still has the cough and the medication he was prescribed pantoprazole (PROTONIX) 20 MG tablet. He said it is not helping with the cough. He also said the inhaler he was suppose to get was too expensive. He wants to know if he can get something else. The pharmacy he use is [facility] on [address], [place], [place] [address]. Their number is [phone]

## 2023-01-03 IMAGING — MR MR SHOULDER*R* W/O CM
4 of 5 series · 31 of 40 positions shown · non-contrast
Comparison: 05/17/2020 radiographs

CLINICAL DATA: Right shoulder pain for 1 year

EXAM:
MRI OF THE RIGHT SHOULDER WITHOUT CONTRAST
TECHNIQUE: Multiplanar, multisequence MR imaging of the shoulder was performed.
No intravenous contrast was administered.

[Series 5: T2 fat-sat · axial · right · 4.0mm · 0.44mm/px · z∈[-39,+77]mm · 8 of 26 slices shown (1 of 3)]
[im 1/26]
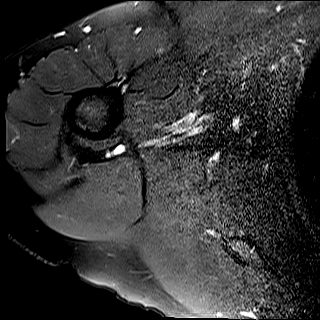
[im 4/26]
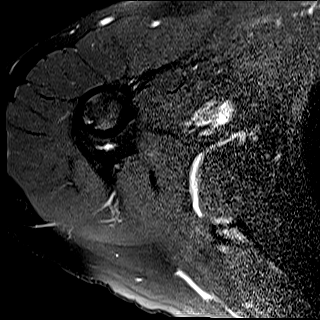
[im 8/26]
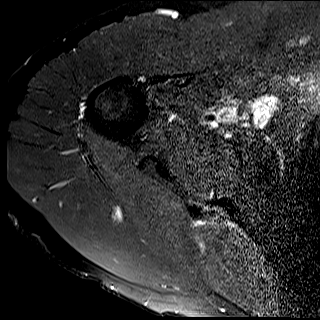
[im 11/26]
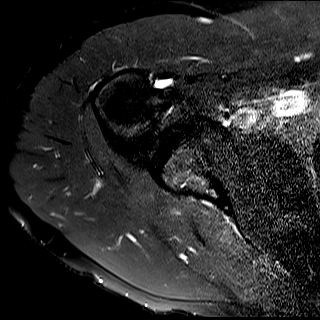
[im 15/26]
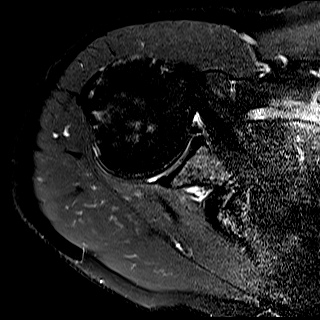
[im 18/26]
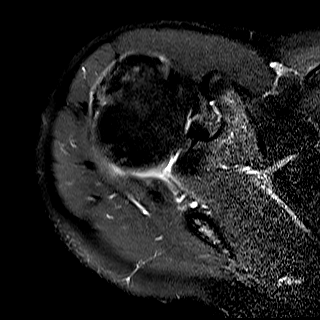
[im 22/26]
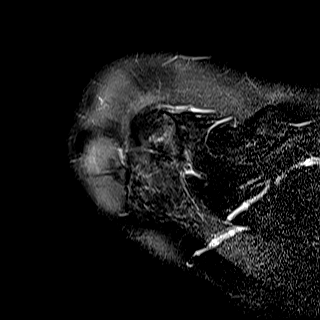
[im 26/26]
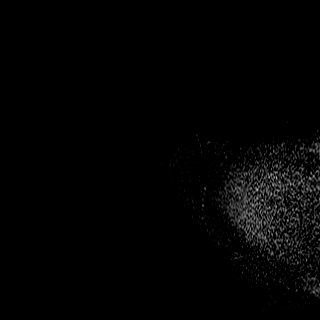

[Series 6: PD · oblique · right · 4.0mm · 0.44mm/px · 9 of 26 slices shown]
[im 1/26]
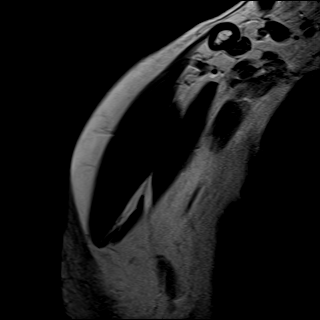
[im 4/26]
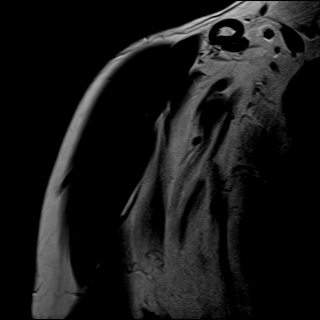
[im 7/26]
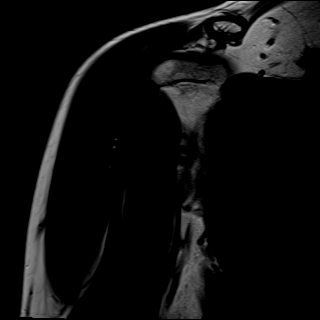
[im 10/26]
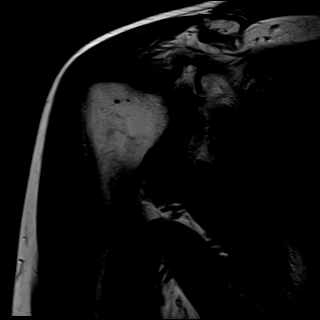
[im 13/26]
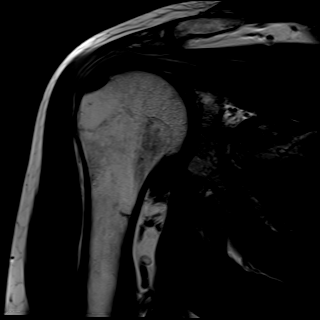
[im 16/26]
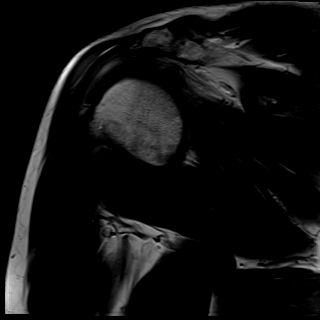
[im 19/26]
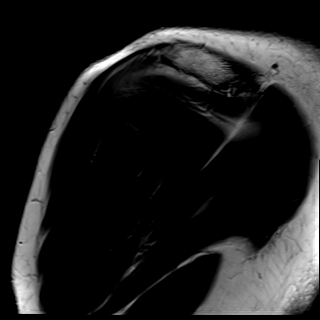
[im 22/26]
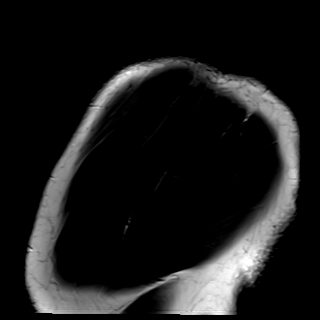
[im 26/26]
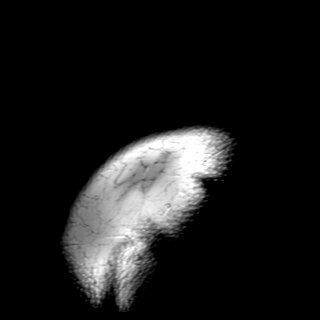

[Series 7: T2 fat-sat · oblique · right · 4.0mm · 0.44mm/px · 9 of 26 slices shown (2 of 3)]
[im 1/26]
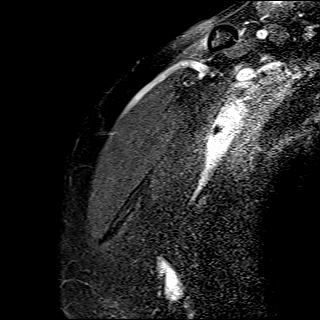
[im 4/26]
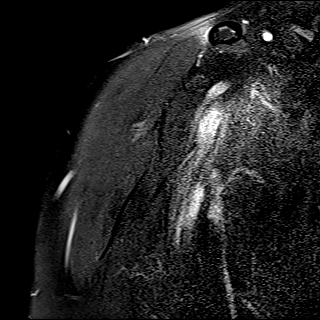
[im 7/26]
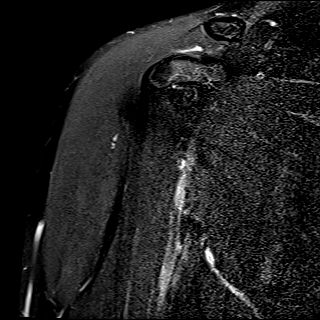
[im 10/26]
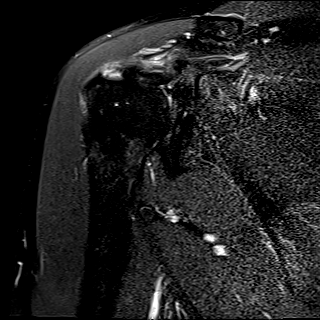
[im 13/26]
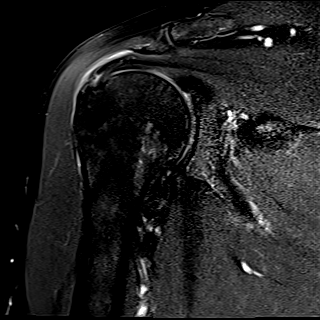
[im 16/26]
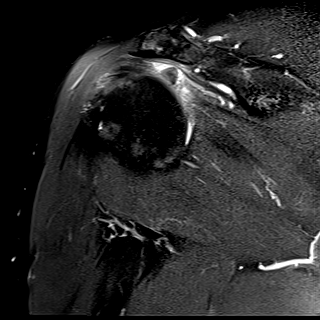
[im 19/26]
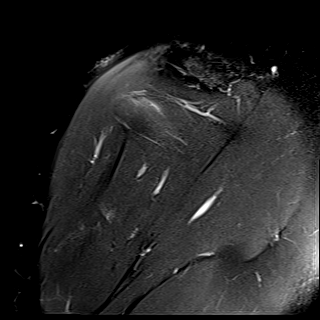
[im 22/26]
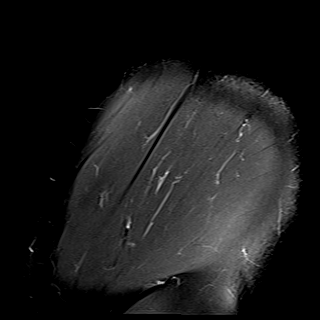
[im 26/26]
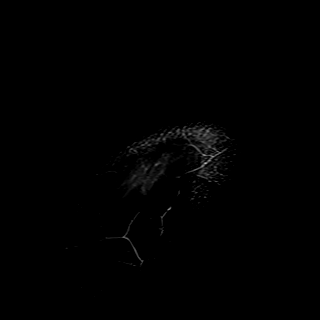

[Series 8: T2 fat-sat · oblique · right · 4.0mm · 0.22mm/px · 5 of 22 slices shown (3 of 3)]
[im 1/22]
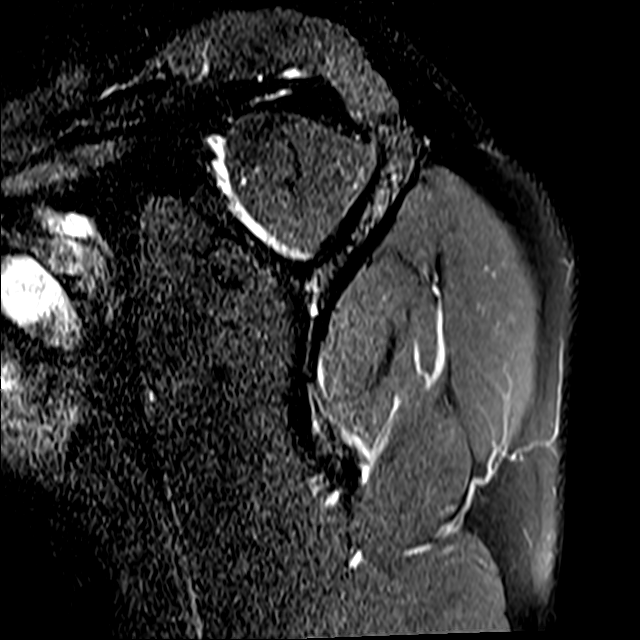
[im 4/22]
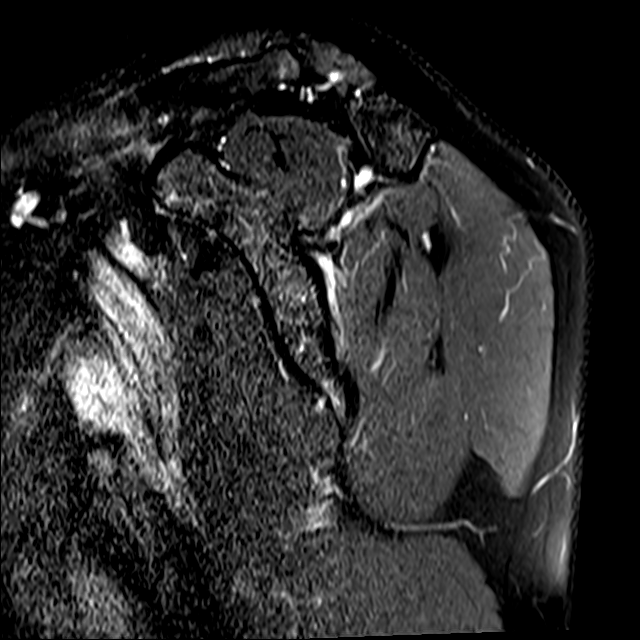
[im 8/22]
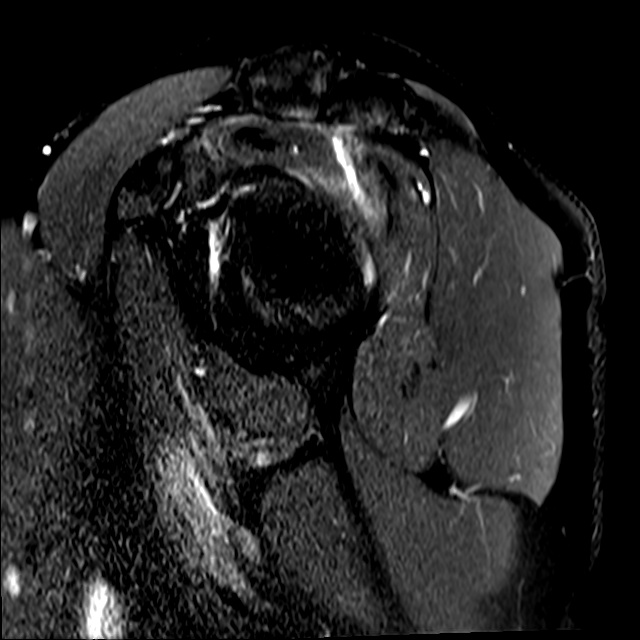
[im 11/22]
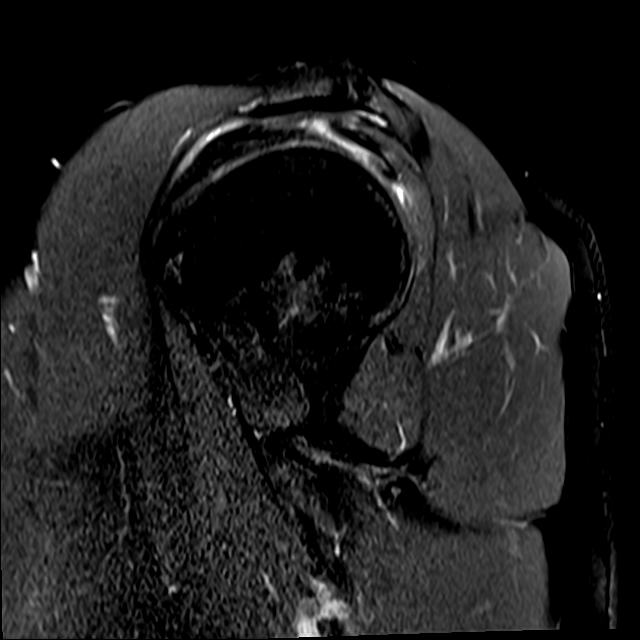
[im 18/22]
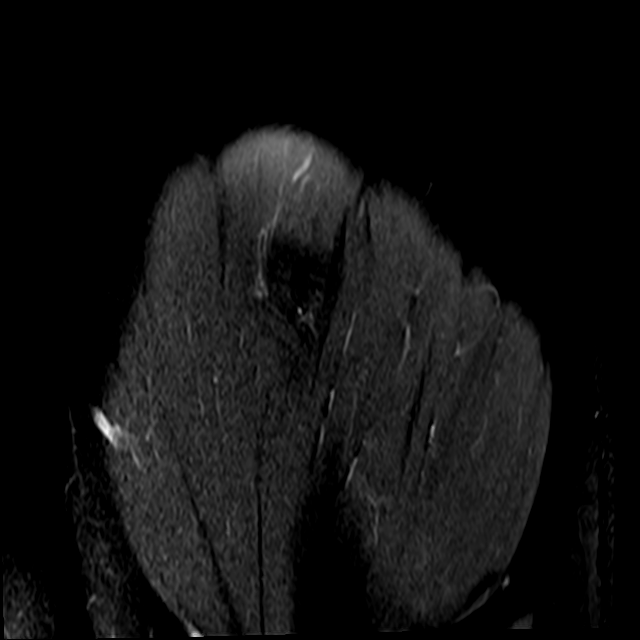

[31 of 40 positions shown; findings below may reference images not displayed]

FINDINGS: Rotator cuff: Moderate distal supraspinatus and infraspinatus
tendinopathy. Partial thickness articular surface tear of the distal
anterior supraspinatus tendon without definite extension to the
bursal surface. Small focus of synovitis interposed between the
supraspinatus and infraspinatus muscles on image 9 series 8.

Muscles:  Unremarkable

Biceps long head:  Unremarkable

Acromioclavicular Joint: Mesoacromial os acromiale without edema
signal along the synchondrosis. Mild spurring at the AC joint. Type
II acromion. Trace subacromial subdeltoid bursitis.

Glenohumeral Joint: Mild degenerative chondral thinning.

Labrum:  Grossly unremarkable

Bones: No significant extra-articular osseous abnormalities
identified.

Other: No supplemental non-categorized findings.
IMPRESSION: 1. Moderate supraspinatus and infraspinatus tendinopathy with
partial thickness articular surface tearing of the distal anterior
supraspinatus tendon. Incidental small focus of synovitis interposed
between the supraspinatus and infraspinatus muscles.
2. Mild degenerative AC joint arthropathy and mild degenerative
chondral thinning in the glenohumeral joint.
3. Trace subacromial subdeltoid bursitis.
4. Mesoacromial os acromiale.

## 2023-01-03 NOTE — Telephone Encounter (Signed)
Brian Forbes team, call pharmacy and ask what inhalers are on his formulary.   What is cost of advair or symbicort?  Please call radiology and have CXR read from 12/29/22 and let pt know we are working on this   Please also let him know that I placed a referral to pulmonology is a concern in regards to chronicity of his cough.  He would likely require pulmonary function testing

## 2023-01-04 MED ORDER — FLUTICASONE-SALMETEROL 100-50 MCG/ACT IN AEPB: 1.0000 | INHALATION_SPRAY | Freq: Two times a day (BID) | RESPIRATORY_TRACT | 2 refills | Status: DC

## 2023-01-04 NOTE — Telephone Encounter (Signed)
Call pt  I have sent in wixela which should be covered by insurance.

## 2023-01-04 NOTE — Telephone Encounter (Signed)
I have sent in wixela

## 2023-01-04 NOTE — Telephone Encounter (Signed)
Called radiology and they will try and get this read

## 2023-01-09 ENCOUNTER — Telehealth: Payer: Self-pay

## 2023-01-09 NOTE — Telephone Encounter (Signed)
-----   Message from Rennie Plowman sent at 01/09/2023  8:35 AM EST ----- Brian Forbes, what is status of pulmonology referral?  [facility] team, please Call pt Chest x-ray does not reveal pneumonia I sent in a new inhaler wixela; please confirm that he is picked up.  Please let me know if it was cost prohibitive

## 2023-01-09 NOTE — Telephone Encounter (Signed)
Spoke to pt. Pt stated that he was still unable to get inhalers due to copay being 49$. Pt stated that he still has a persistent cough and would like his Albuterol inhaler refilled as well. Medication has been pended

## 2023-01-09 NOTE — Telephone Encounter (Signed)
Called pt to inform of chest xray results. Unable to leave vm due to mailbox being full.     Sent telephone encounter in regards to inhalers

## 2023-01-10 ENCOUNTER — Encounter: Payer: Self-pay | Admitting: *Deleted

## 2023-01-10 MED ORDER — ALBUTEROL SULFATE HFA 108 (90 BASE) MCG/ACT IN AERS: 1.0000 | INHALATION_SPRAY | Freq: Four times a day (QID) | RESPIRATORY_TRACT | 3 refills | Status: DC | PRN

## 2023-01-13 ENCOUNTER — Telehealth: Payer: Self-pay | Admitting: Family

## 2023-01-13 ENCOUNTER — Other Ambulatory Visit: Payer: Self-pay

## 2023-01-13 DIAGNOSIS — R053 Chronic cough: Secondary | ICD-10-CM

## 2023-01-13 MED ORDER — AMLODIPINE BESY-BENAZEPRIL HCL 10-20 MG PO CAPS: 1.0000 | ORAL_CAPSULE | ORAL | 1 refills | Status: DC

## 2023-01-13 MED ORDER — ATORVASTATIN CALCIUM 10 MG PO TABS: 10.0000 mg | ORAL_TABLET | Freq: Every morning | ORAL | 2 refills | Status: DC

## 2023-01-13 MED ORDER — PANTOPRAZOLE SODIUM 20 MG PO TBEC: 40.0000 mg | DELAYED_RELEASE_TABLET | ORAL | 1 refills | Status: DC

## 2023-01-13 NOTE — Telephone Encounter (Signed)
Called pt was unable to LVM vm was full

## 2023-01-13 NOTE — Addendum Note (Signed)
Addended by: [place], Claudy Abdallah on: 01/13/2023 10:54 AM   Modules accepted: Orders

## 2023-01-13 NOTE — Telephone Encounter (Signed)
Patient called and is needing a med refill on  amLODipine-benazepril (LOTREL) 10-20 MG capsule  and on  Omeprazole   Please send to [facility] on [address]

## 2023-01-27 ENCOUNTER — Ambulatory Visit: Payer: No Typology Code available for payment source

## 2023-02-11 DIAGNOSIS — M109 Gout, unspecified: Secondary | ICD-10-CM | POA: Diagnosis not present

## 2023-02-20 ENCOUNTER — Telehealth: Payer: Self-pay | Admitting: Family

## 2023-02-20 DIAGNOSIS — Z7901 Long term (current) use of anticoagulants: Secondary | ICD-10-CM

## 2023-02-20 NOTE — Telephone Encounter (Signed)
 Copied from CRM [id number]. Topic: Clinical - Medication Refill >> Feb 20, 2023 11:33 AM Leotis ORN wrote: Most Recent Primary Care Visit:  Provider: [facility]  Department: [facility]  Visit Type: LAB  Date: 12/29/2022  Medication: prednisone  (he was given a 3x supply from walk-in clinic at [facility], he stated it helped his gout pain, pt was scheduled for follow up 03/08/23  he is wanting to have enough prednisone  called in until his appt   Has the patient contacted their pharmacy? No (Agent: If no, request that the patient contact the pharmacy for the refill. If patient does not wish to contact the pharmacy document the reason why and proceed with request.) (Agent: If yes, when and what did the pharmacy advise?)  Is this the correct pharmacy for this prescription? Yes If no, delete pharmacy and type the correct one.  This is the patient's preferred pharmacy:  [facility] [address], [place] - [address] [place] [address] Phone: [phone] Fax: [phone]     Has the prescription been filled recently? Yes  Is the patient out of the medication? Yes  Has the patient been seen for an appointment in the last year OR does the patient have an upcoming appointment? Yes  Can we respond through MyChart? No  Agent: Please be advised that Rx refills may take up to 3 business days. We ask that you follow-up with your pharmacy.

## 2023-02-21 NOTE — Telephone Encounter (Signed)
 Was unable to lvm due to vm being full to inform pt of below message   Please move appt up; it is hard for me to treat when I have not evaluated.  He may be seen sooner by colleague as well   Gout is typically self-limiting and  pain would have improved by now.     How is he feeling on prednisone?     Specifically which toe is bothering him.  Does he have any open wounds?  Is he having any fever chill or swelling in his foot?   Please let me know

## 2023-02-21 NOTE — Telephone Encounter (Signed)
 Call pt Please move appt up; it is hard for me to treat when I have not evaluated.  He may be seen sooner by colleague as well  Gout is typically self-limiting and  pain would have improved by now.    How is he feeling on prednisone ?    Specifically which toe is bothering him.  Does he have any open wounds?  Is he having any fever chill or swelling in his foot?  Please let me know

## 2023-02-23 ENCOUNTER — Ambulatory Visit: Payer: No Typology Code available for payment source | Admitting: Family

## 2023-02-23 NOTE — Telephone Encounter (Signed)
Was unable to lvm due to vm being full to inform pt of below message   Please move appt up; it is hard for me to treat when I have not evaluated.  He may be seen sooner by colleague as well   Gout is typically self-limiting and  pain would have improved by now.     How is he feeling on prednisone?     Specifically which toe is bothering him.  Does he have any open wounds?  Is he having any fever chill or swelling in his foot?   Please let me know

## 2023-02-24 NOTE — Addendum Note (Signed)
Addended by: [place], Adah Stoneberg on: 02/24/2023 11:54 AM   Modules accepted: Orders

## 2023-02-24 NOTE — Addendum Note (Signed)
Addended by: [place], Raiana Pharris on: 02/24/2023 11:55 AM   Modules accepted: Orders

## 2023-02-27 ENCOUNTER — Encounter: Payer: Self-pay | Admitting: Internal Medicine

## 2023-02-27 ENCOUNTER — Ambulatory Visit (INDEPENDENT_AMBULATORY_CARE_PROVIDER_SITE_OTHER): Payer: No Typology Code available for payment source | Admitting: Internal Medicine

## 2023-02-27 ENCOUNTER — Ambulatory Visit: Payer: Self-pay | Admitting: Family

## 2023-02-27 VITALS — BP 114/74 | HR 81 | Temp 97.8°F | Ht 65.0 in | Wt 160.2 lb

## 2023-02-27 DIAGNOSIS — M10071 Idiopathic gout, right ankle and foot: Secondary | ICD-10-CM | POA: Diagnosis not present

## 2023-02-27 DIAGNOSIS — M109 Gout, unspecified: Secondary | ICD-10-CM | POA: Insufficient documentation

## 2023-02-27 MED ORDER — PREDNISONE 20 MG PO TABS: 40.0000 mg | ORAL_TABLET | Freq: Every day | ORAL | 0 refills | Status: DC

## 2023-02-27 NOTE — Telephone Encounter (Signed)
Copied from CRM [id number]. Topic: Clinical - Red Word Triage >> Feb 27, 2023  8:41 AM Marica Otter wrote: [facility] that prompted transfer to Nurse Triage: Severe pain in right foot swollen, barely can walk has to hold to things to walk. Has gout   Chief Complaint: foot pain/swollen Symptoms: 10/10 foot pain, swelling Frequency: constant Pertinent Negatives: Patient denies fever, redness, foot coolness/discoloration Disposition: [] 911 / [] ED /[] Urgent Care (no appt availability in office) / [x] Appointment(In office/virtual)/ []  [facility]/ [] Home Care/ [] Refused Recommended Disposition /[]  Mobile Bus/ []  Follow-up with PCP Additional Notes: Pt reporting severe 10/10 pain in right foot especially in "big toe, across foot" with some swelling. Pt thinking "gout again," reporting prednisone and other pill for pain helped other times with gout. Pt confirms no fever, redness, pus, or discoloration to foot. Advised pt be examined shortly today, scheduled earliest appt with PCP office, within 5 hours, gave appt/location info. Pt verbalized understanding.  Reason for Disposition  [1] SEVERE pain (e.g., excruciating, unable to do any normal activities) AND [2] not improved after 2 hours of pain medicine  Answer Assessment - Initial Assessment Questions 1. ONSET: "When did the pain start?"      Thursday but just got worse and worse 2. LOCATION: "Where is the pain located?"      Right foot, big toe and across my foot 3. PAIN: "How bad is the pain?"    (Scale 1-10; or mild, moderate, severe)  - MILD (1-3): doesn't interfere with normal activities.   - MODERATE (4-7): interferes with normal activities (e.g., work or school) or awakens from sleep, limping.   - SEVERE (8-10): excruciating pain, unable to do any normal activities, unable to walk.      10/10, barely able to walk on it, hurts worse when weight on it, been trying to keep it elevated 4. WORK OR EXERCISE: "Has there been any  recent work or exercise that involved this part of the body?"      denies 5. CAUSE: "What do you think is causing the foot pain?"     Think just the gout again 6. OTHER SYMPTOMS: "Do you have any other symptoms?" (e.g., leg pain, rash, fever, numbness)     denies  Protocols used: Foot Pain-A-AH

## 2023-02-27 NOTE — Progress Notes (Signed)
Acute Office Visit  Subjective:     Patient ID: Brian Forbes, male    DOB: 1955/10/06, 68 y.o.   MRN: [id number]  Chief Complaint  Patient presents with   Acute Visit    Suspected gout flare Right foot severe pain & swelling    HPI Patient is in today for severe pain in his right foot at the base of his right first toe.  Patient states that at the beginning of the year he developed significant pain in his left first toe and was seen in [place] [facility] and diagnosed with gout and treated with prednisone.  His symptoms resolved but 4 days ago he developed pain in his right first toe and had difficulty ambulating.  Patient states the pain progressively worsened and came to [place] for further evaluation.  He denies any fevers or chills.  No history of trauma.  Review of Systems  Constitutional: Negative.   HENT: Negative.    Respiratory: Negative.    Musculoskeletal:  Positive for joint pain.       Significant pain in his right foot especially at the base of his first toe  Neurological: Negative.   Psychiatric/Behavioral: Negative.          Objective:    BP 114/74   Pulse 81   Temp 97.8 F (36.6 C)   Ht 5\' 5"  (1.651 m)   Wt 160 lb 3.2 oz (72.7 kg)   SpO2 97%   BMI 26.66 kg/m    Physical Exam Constitutional:      Appearance: Normal appearance.  HENT:     Head: Normocephalic and atraumatic.  Musculoskeletal:        General: Swelling and tenderness present.     Right lower leg: Edema present.     Left lower leg: No edema.     Comments: Patient with significant tenderness to palpation at the base of his right first toe with associated swelling over the dorsum of his foot and mild increased local warmth especially at the base of his first toe  Neurological:     Mental Status: He is alert.  Psychiatric:        Mood and Affect: Mood normal.        Behavior: Behavior normal.     No results found for any visits on 02/27/23.      Assessment & Plan:   Problem  List Items Addressed This Visit       Other   Gout - Primary   -Patient presents today with acute pain in his right foot especially at the base of his right first toe and difficulty ambulating in setting of a history of recently diagnosed gout in his left foot -On exam, patient had significant tenderness to palpation at the base of his right first toe with associated local warmth and swelling over the dorsum of his foot -Patient was noted to have an elevated uric acid level of 8.8 at the beginning of the year -This is likely an acute gout exacerbation -Will treat the patient with prednisone 40 mg daily for 5 days and ibuprofen as needed for pain control -Patient will likely need chronic gout management after this acute flare resolves (with allopurinol titration until his uric acid level is less than 6).  Will also likely need daily colchicine until he achieves his goal uric acid level and is on a stable dose of allopurinol.  Will defer to PCP for chronic management      Relevant Medications  predniSONE (DELTASONE) 20 MG tablet    Meds ordered this encounter  Medications   predniSONE (DELTASONE) 20 MG tablet    Sig: Take 2 tablets (40 mg total) by mouth daily with breakfast.    Dispense:  10 tablet    Refill:  0    No follow-ups on file.  Earl Lagos, MD

## 2023-02-27 NOTE — Patient Instructions (Addendum)
-  It was a pleasure meeting you today -It looks like you have an acute gout attack in your right big toe -I will prescribe a course of prednisone 40 mg daily for 5 days for this -Take ibuprofen 400 mg as needed every 6 hours for pain control -Please call us if your symptoms do not improve or if they continue to worsen -You will likely need chronic gout management with a medication like allopurinol to lower uric acid level.  Please follow-up with your PCP to initiate this management

## 2023-02-27 NOTE — Telephone Encounter (Signed)
Patient was seen by Dr. Heide Spark on 02/2023 and it is the Patient's right foot with swelling and pain. Dr. Heide Spark prescribed prednisone.

## 2023-02-27 NOTE — Telephone Encounter (Signed)
FYI

## 2023-02-27 NOTE — Assessment & Plan Note (Signed)
-  Patient presents today with acute pain in his right foot especially at the base of his right first toe and difficulty ambulating in setting of a history of recently diagnosed gout in his left foot -On exam, patient had significant tenderness to palpation at the base of his right first toe with associated local warmth and swelling over the dorsum of his foot -Patient was noted to have an elevated uric acid level of 8.8 at the beginning of the year -This is likely an acute gout exacerbation -Will treat the patient with prednisone 40 mg daily for 5 days and ibuprofen as needed for pain control -Patient will likely need chronic gout management after this acute flare resolves (with allopurinol titration until his uric acid level is less than 6).  Will also likely need daily colchicine until he achieves his goal uric acid level and is on a stable dose of allopurinol.  Will defer to PCP for chronic management

## 2023-02-28 ENCOUNTER — Ambulatory Visit: Payer: No Typology Code available for payment source | Admitting: Family Medicine

## 2023-02-28 ENCOUNTER — Ambulatory Visit: Payer: No Typology Code available for payment source | Admitting: Family

## 2023-02-28 NOTE — Progress Notes (Deleted)
   Acute Office Visit  Subjective:     Patient ID: ERNIE HALAS, male    DOB: 1955-06-05, 68 y.o.   MRN: [id number]  No chief complaint on file.   HPI Patient is in today for ***  ROS Per HPI      Objective:    There were no vitals taken for this visit.   Physical Exam Vitals and nursing note reviewed.  Constitutional:      Appearance: Normal appearance.  HENT:     Head: Normocephalic and atraumatic.  Eyes:     Extraocular Movements: Extraocular movements intact.  Cardiovascular:     Rate and Rhythm: Normal rate and regular rhythm.     Pulses: Normal pulses.     Heart sounds: Normal heart sounds.  Pulmonary:     Effort: Pulmonary effort is normal.     Breath sounds: Normal breath sounds.  Musculoskeletal:        General: Normal range of motion.     Cervical back: Normal range of motion.  Skin:    General: Skin is warm and dry.  Neurological:     General: No focal deficit present.     Mental Status: He is alert and oriented to person, place, and time.  Psychiatric:        Mood and Affect: Mood normal.        Behavior: Behavior normal.   No results found for any visits on 02/28/23.      Assessment & Plan:  ***  No orders of the defined types were placed in this encounter.   No follow-ups on file.  Moshe Cipro, FNP

## 2023-03-01 ENCOUNTER — Other Ambulatory Visit: Payer: Self-pay | Admitting: Family

## 2023-03-01 DIAGNOSIS — R053 Chronic cough: Secondary | ICD-10-CM

## 2023-03-01 MED ORDER — ATORVASTATIN CALCIUM 10 MG PO TABS: 10.0000 mg | ORAL_TABLET | Freq: Every morning | ORAL | 2 refills | Status: DC

## 2023-03-01 MED ORDER — FAMOTIDINE 20 MG PO TABS: 20.0000 mg | ORAL_TABLET | Freq: Every evening | ORAL | 2 refills | Status: DC

## 2023-03-01 NOTE — Telephone Encounter (Signed)
Copied from CRM [id number]. Topic: Clinical - Medication Refill >> Mar 01, 2023 10:22 AM Dennison Nancy wrote: Most Recent Primary Care Visit:  Provider: Earl Lagos  Department: [facility]  Visit Type: ACUTE  Date: 02/27/2023  Medication:  Disp Refills Start End  famotidine (PEPCID) 20 MG tablet,  albuterol (VENTOLIN HFA) 108 (90 Base) MCG/ACT inhaler     Has the patient contacted their pharmacy? No , pharmacy will tell the patient to call his provider (Agent: If no, request that the patient contact the pharmacy for the refill. If patient does not wish to contact the pharmacy document the reason why and proceed with request.) (Agent: If yes, when and what did the pharmacy advise?)  Is this the correct pharmacy for this prescription? Yes If no, delete pharmacy and type the correct one.  This is the patient's preferred pharmacy:   [facility] [address] [place] - [address] [place] [address] Phone: [phone] Fax: [phone]   Has the prescription been filled recently? No , been 30 days   Is the patient out of the medication? Yes  Has the patient been seen for an appointment in the last year OR does the patient have an upcoming appointment? Yes  Can we respond through MyChart? No   Agent: Please be advised that Rx refills may take up to 3 business days. We ask that you follow-up with your pharmacy.

## 2023-03-06 DIAGNOSIS — M109 Gout, unspecified: Secondary | ICD-10-CM | POA: Diagnosis not present

## 2023-03-08 ENCOUNTER — Telehealth: Payer: Self-pay

## 2023-03-08 ENCOUNTER — Ambulatory Visit: Payer: No Typology Code available for payment source | Admitting: Family

## 2023-03-08 NOTE — Telephone Encounter (Signed)
Copied from CRM [id number]. Topic: General - Other >> Mar 08, 2023  9:45 AM Eunice Blase wrote: Reason for CRM: Received call from [facility] per Caryn Bee ph: [phone] fax: [phone] needs all active medication sent to [facility].They sent fax on 03/05/23. Please call pt [phone].

## 2023-03-08 NOTE — Telephone Encounter (Signed)
Faxed all pt medication back to  [facility] @ fax: [phone] active medication sent to [facility].They sent fax on 03/07/23. Called pt to inform him but pt vm was full unable to lvm . Pt has in office appt on 03/09/23  will discuss

## 2023-03-09 ENCOUNTER — Ambulatory Visit: Payer: No Typology Code available for payment source | Admitting: Family

## 2023-03-10 NOTE — Telephone Encounter (Signed)
 DONE

## 2023-03-20 ENCOUNTER — Other Ambulatory Visit: Payer: Self-pay | Admitting: Family

## 2023-03-20 DIAGNOSIS — R053 Chronic cough: Secondary | ICD-10-CM

## 2023-03-20 NOTE — Telephone Encounter (Signed)
 Copied from CRM [id number]. Topic: Clinical - Medication Refill >> Mar 20, 2023 10:56 AM Jayson Michael wrote: Most Recent Primary Care Visit:  Provider: Jeffie Mingle  Department: [facility]  Visit Type: ACUTE  Date: 02/27/2023  Medication: famotidine  (PEPCID ) 20 MG tablet, albuterol  (VENTOLIN  HFA) 108 (90 Base) MCG/ACT inhaler  Has the patient contacted their pharmacy? Yes (Agent: If no, request that the patient contact the pharmacy for the refill. If patient does not wish to contact the pharmacy document the reason why and proceed with request.) (Agent: If yes, when and what did the pharmacy advise?) have not received it   Is this the correct pharmacy for this prescription? Yes If no, delete pharmacy and type the correct one.  This is the patient's preferred pharmacy:  [facility] [address] [place] - [address] [place] [address] Phone: [phone] Fax: [phone]     Has the prescription been filled recently? No  Is the patient out of the medication? Yes  Has the patient been seen for an appointment in the last year OR does the patient have an upcoming appointment? Yes  Can we respond through MyChart? Yes  Agent: Please be advised that Rx refills may take up to 3 business days. We ask that you follow-up with your pharmacy.

## 2023-03-20 NOTE — Telephone Encounter (Signed)
 Last Fill: Pepcid : 03/01/23     Ventolin : 01/10/23  Last OV: Next OV:  Routing to provider for review/authorization.

## 2023-03-21 MED ORDER — FAMOTIDINE 20 MG PO TABS: 20.0000 mg | ORAL_TABLET | Freq: Every evening | ORAL | 3 refills | Status: DC

## 2023-03-21 MED ORDER — ALBUTEROL SULFATE HFA 108 (90 BASE) MCG/ACT IN AERS: 1.0000 | INHALATION_SPRAY | Freq: Four times a day (QID) | RESPIRATORY_TRACT | 3 refills | Status: DC | PRN

## 2023-04-11 ENCOUNTER — Other Ambulatory Visit: Payer: Self-pay | Admitting: Family

## 2023-04-24 ENCOUNTER — Other Ambulatory Visit: Payer: Self-pay | Admitting: Family

## 2023-04-24 NOTE — Telephone Encounter (Unsigned)
 Copied from CRM [id number]. Topic: Clinical - Medication Refill >> Apr 24, 2023  9:34 AM Truddie Crumble wrote: Most Recent Primary Care Visit:  Provider: Earl Lagos  Department: [facility]  Visit Type: ACUTE  Date: 02/27/2023  Medication: amLODipine-benazepril (LOTREL) 10-20 MG capsule and acid reflex medication  Has the patient contacted their pharmacy? No (Agent: If no, request that the patient contact the pharmacy for the refill. If patient does not wish to contact the pharmacy document the reason why and proceed with request.) (Agent: If yes, when and what did the pharmacy advise?)  Is this the correct pharmacy for this prescription? Yes If no, delete pharmacy and type the correct one.  This is the patient's preferred pharmacy:  [facility] [address] [place] - [address] [place] [address] Phone: [phone] Fax: [phone]  Has the prescription been filled recently? Yes  Is the patient out of the medication? Yes  Has the patient been seen for an appointment in the last year OR does the patient have an upcoming appointment? Yes  Can we respond through MyChart? No  Agent: Please be advised that Rx refills may take up to 3 business days. We ask that you follow-up with your pharmacy.

## 2023-04-24 NOTE — Telephone Encounter (Signed)
 Copied from CRM [id number]. Topic: Clinical - Medication Refill >> Apr 24, 2023  9:34 AM Truddie Crumble wrote: Most Recent Primary Care Visit:  Provider: Earl Lagos  [facility]  Visit Type: ACUTE  Date: 02/27/2023  Medication: amLODipine-benazepril (LOTREL) 10-20 MG capsule and acid reflex medication (patient did not know his reflex medication name)  Has the patient contacted their pharmacy? No (Agent: If no, request that the patient contact the pharmacy for the refill. If patient does not wish to contact the pharmacy document the reason why and proceed with request.) (Agent: If yes, when and what did the pharmacy advise?)  Is this the correct pharmacy for this prescription? Yes If no, delete pharmacy and type the correct one.  This is the patient's preferred pharmacy:  [facility] [address] [place] - [address] [place] [address] Phone: [phone] Fax: [phone]  Has the prescription been filled recently? Yes  Is the patient out of the medication? Yes  Has the patient been seen for an appointment in the last year OR does the patient have an upcoming appointment? Yes  Can we respond through MyChart? No  Agent: Please be advised that Rx refills may take up to 3 business days. We ask that you follow-up with your pharmacy.

## 2023-04-25 MED ORDER — AMLODIPINE BESY-BENAZEPRIL HCL 10-20 MG PO CAPS: 1.0000 | ORAL_CAPSULE | ORAL | 1 refills | Status: DC

## 2023-04-26 ENCOUNTER — Other Ambulatory Visit: Payer: Self-pay | Admitting: Family

## 2023-04-26 DIAGNOSIS — R053 Chronic cough: Secondary | ICD-10-CM

## 2023-04-26 NOTE — Telephone Encounter (Signed)
 Amlodipine sent on 04/25/23.   Copied from CRM [id number]. Topic: Clinical - Medication Refill >> Apr 24, 2023  9:34 AM Truddie Crumble wrote: Most Recent Primary Care Visit:  Provider: Earl Lagos  [facility]  Visit Type: ACUTE  Date: 02/27/2023  Medication: amLODipine-benazepril (LOTREL) 10-20 MG capsule and acid reflex medication  Has the patient contacted their pharmacy? No (Agent: If no, request that the patient contact the pharmacy for the refill. If patient does not wish to contact the pharmacy document the reason why and proceed with request.) (Agent: If yes, when and what did the pharmacy advise?)  Is this the correct pharmacy for this prescription? Yes If no, delete pharmacy and type the correct one.  This is the patient's preferred pharmacy:  [facility] [address] [place] - [address] [place] [address] Phone: [phone] Fax: [phone]  Has the prescription been filled recently? Yes  Is the patient out of the medication? Yes  Has the patient been seen for an appointment in the last year OR does the patient have an upcoming appointment? Yes  Can we respond through MyChart? No  Agent: Please be advised that Rx refills may take up to 3 business days. We ask that you follow-up with your pharmacy. >> Apr 26, 2023 11:33 AM Adele Barthel wrote: Patient is calling in regarding recent refill requests for amlodipine and omeprazole. Advised patient he had 1 remaining refill for amlodipine at the [facility] on file. He is specifically requesting omeprazole for refill. Omeprazole is not on med list, but has been prescribed in the past and has been very beneficial. Did advise would take up to 3 days to process refill request. Read previous request and did not see specific med name for his anti reflux med so wanted to update.

## 2023-05-09 DIAGNOSIS — M1A00X0 Idiopathic chronic gout, unspecified site, without tophus (tophi): Secondary | ICD-10-CM | POA: Diagnosis not present

## 2023-06-22 ENCOUNTER — Other Ambulatory Visit: Payer: Self-pay | Admitting: Family

## 2023-07-08 NOTE — Patient Instructions (Signed)

## 2023-07-11 ENCOUNTER — Other Ambulatory Visit: Payer: Self-pay | Admitting: Family

## 2023-07-12 ENCOUNTER — Encounter: Payer: Self-pay | Admitting: Nurse Practitioner

## 2023-07-12 ENCOUNTER — Ambulatory Visit (INDEPENDENT_AMBULATORY_CARE_PROVIDER_SITE_OTHER): Payer: Self-pay | Admitting: Nurse Practitioner

## 2023-07-12 VITALS — BP 112/62 | HR 83 | Temp 98.0°F | Resp 18 | Ht 65.0 in | Wt 170.4 lb

## 2023-07-12 DIAGNOSIS — Z0289 Encounter for other administrative examinations: Secondary | ICD-10-CM

## 2023-07-12 NOTE — Progress Notes (Signed)
 BP 112/62   Pulse 83   Temp 98 F (36.7 C) (Oral)   Resp 18   Ht 5\' 5"  (1.651 m)   Wt 170 lb 6.4 oz (77.3 kg)   SpO2 98%   BMI 28.36 kg/m    Subjective:    Patient ID: Brian Forbes, male    DOB: [birth year], 68 y.o.   MRN: [id number]  HPI: KESHAWN Forbes is a 68 y.o. male presenting on 07/12/2023 for DOT Physical.  Previous DOT physical was on 08/09/22.  Currently patient drives locally and does not leave state, for [facility]   Past Medical History:  Past Medical History:  Diagnosis Date   Arthritis    Asthma    GERD (gastroesophageal reflux disease)    Hyperlipidemia    Hypertension     Surgical History:  Past Surgical History:  Procedure Laterality Date   COLONOSCOPY     SHOULDER ARTHROSCOPY WITH SUBACROMIAL DECOMPRESSION, ROTATOR CUFF REPAIR AND BICEP TENDON REPAIR Right 11/10/2020   Procedure: SHOULDER ARTHROSCOPY WITH DEBRIDEMENT, DECOMPRESSION, PARTIAL THICKNESS ROTATOR CUFF REPAIR AND BICEP TENODESIS;  Surgeon: Elner Hahn, MD;  Location: [facility] ORS;  Service: Orthopedics;  Laterality: Right;    Medications:  Current Outpatient Medications on File Prior to Visit  Medication Sig   albuterol  (VENTOLIN  HFA) 108 (90 Base) MCG/ACT inhaler INHALE 1 TO 2 PUFFS INTO LUNGS EVERY 6 HOURS AS NEEDED FOR WHEEZING AND SHORTNESS OF BREATH   amLODipine -benazepril  (LOTREL) 10-20 MG capsule Take 1 capsule by mouth every morning.   atorvastatin  (LIPITOR) 10 MG tablet Take 1 tablet (10 mg total) by mouth every morning.   famotidine  (PEPCID ) 20 MG tablet Take 1 tablet (20 mg total) by mouth every evening.   fluticasone -salmeterol (WIXELA INHUB) 100-50 MCG/ACT AEPB Inhale 1 puff into the lungs 2 (two) times daily.   loratadine  (CLARITIN ) 10 MG tablet Take 1 tablet by mouth once daily   naproxen  (NAPROSYN ) 500 MG tablet Take 1 tablet (500 mg total) by mouth 2 (two) times daily with a meal.   pantoprazole  (PROTONIX ) 20 MG tablet Take 2 tablets (40 mg total) by mouth every  morning. Take 30 minutes to hour before breakfast   predniSONE  (DELTASONE ) 20 MG tablet Take 2 tablets (40 mg total) by mouth daily with breakfast.   No current facility-administered medications on file prior to visit.    Allergies:  No Known Allergies  Social History:  Social History   Socioeconomic History   Marital status: Married    Spouse name: Not on file   Number of children: Not on file   Years of education: Not on file   Highest education level: Not on file  Occupational History   Not on file  Tobacco Use   Smoking status: Former    Current packs/day: 0.00    Average packs/day: 0.3 packs/day for 10.0 years (2.5 ttl pk-yrs)    Types: Cigarettes    Start date: 02/07/2009    Quit date: 02/08/2019    Years since quitting: 4.4   Smokeless tobacco: Never  Vaping Use   Vaping status: Never Used  Substance and Sexual Activity   Alcohol use: Yes    Comment: occ beer   Drug use: Never   Sexual activity: Not on file  Other Topics Concern   Not on file  Social History Narrative   From [place], [place]   He is family of 7 siblings total      Moved to his area due to his  daughter   2 daughters - 67, 24   8 grandchildren, and 8 great grandchildren      Married , 49 years.   Retired from [employer]   Works part time at [employer] which he enjoys.    Social Drivers of Health   Financial Resource Strain: Patient Declined (05/09/2023)   Received from [facility]   Overall Financial Resource Strain (CARDIA)    Difficulty of Paying Living Expenses: Patient declined  Food Insecurity: Patient Declined (05/09/2023)   Received from [facility]   Hunger Vital Sign    Worried About Running Out of Food in the Last Year: Patient declined    Ran Out of Food in the Last Year: Patient declined  Transportation Needs: Patient Declined (05/09/2023)   Received from [facility] - Transportation    In the past 12 months, has lack of  transportation kept you from medical appointments or from getting medications?: Patient declined    Lack of Transportation (Non-Medical): Patient declined  Physical Activity: Not on file  Stress: Not on file  Social Connections: Unknown (06/21/2021)   Received from [facility]   Social Network    Social Network: Not on file  Intimate Partner Violence: Not At Risk (08/17/2021)   Received from [facility]   Humiliation, Afraid, Rape, and Kick questionnaire    Fear of Current or Ex-Partner: No    Emotionally Abused: No    Physically Abused: No    Sexually Abused: No   Social History   Tobacco Use  Smoking Status Former   Current packs/day: 0.00   Average packs/day: 0.3 packs/day for 10.0 years (2.5 ttl pk-yrs)   Types: Cigarettes   Start date: 02/07/2009   Quit date: 02/08/2019   Years since quitting: 4.4  Smokeless Tobacco Never   Social History   Substance and Sexual Activity  Alcohol Use Yes   Comment: occ beer    Family History:  Family History  Problem Relation Age of Onset   Stomach cancer Mother 48   Pancreatitis Father 41   Heart attack Sister 60   Colon cancer Neg Hx    Prostate cancer Neg Hx     Past medical history, surgical history, medications, allergies, family history and social history reviewed with patient today and changes made to appropriate areas of the chart.   ROS All other ROS negative except what is listed above and in the HPI.      Objective:     BP 112/62   Pulse 83   Temp 98 F (36.7 C) (Oral)   Resp 18   Ht 5\' 5"  (1.651 m)   Wt 170 lb 6.4 oz (77.3 kg)   SpO2 98%   BMI 28.36 kg/m   Wt Readings from Last 3 Encounters:  07/12/23 170 lb 6.4 oz (77.3 kg)  02/27/23 160 lb 3.2 oz (72.7 kg)  12/28/22 166 lb 3.2 oz (75.4 kg)    Physical Exam Vitals and nursing note reviewed.  Constitutional:      General: He is awake. He is not in acute distress.    Appearance: He is well-developed and  well-groomed. He is not ill-appearing or toxic-appearing.  HENT:     Head: Normocephalic and atraumatic.     Right Ear: Hearing, tympanic membrane, ear canal and external ear normal. No drainage.     Left Ear: Hearing, tympanic membrane, ear canal and external ear  normal. No drainage.     Nose: Nose normal.     Mouth/Throat:     Pharynx: Uvula midline.  Eyes:     General: Lids are normal.        Right eye: No discharge.        Left eye: No discharge.     Extraocular Movements: Extraocular movements intact.     Conjunctiva/sclera: Conjunctivae normal.     Pupils: Pupils are equal, round, and reactive to light.     Visual Fields: Right eye visual fields normal and left eye visual fields normal.     Comments: Visual fields 70 degrees both side.  Neck:     Thyroid: No thyromegaly.     Vascular: No carotid bruit.  Cardiovascular:     Rate and Rhythm: Normal rate and regular rhythm.     Heart sounds: Normal heart sounds, S1 normal and S2 normal. No murmur heard.    No gallop.  Pulmonary:     Effort: Pulmonary effort is normal. No accessory muscle usage or respiratory distress.     Breath sounds: Normal breath sounds. No decreased breath sounds, wheezing or rales.  Abdominal:     General: Bowel sounds are normal. There is no distension.     Palpations: Abdomen is soft. There is no hepatomegaly or splenomegaly.     Tenderness: There is no abdominal tenderness.  Musculoskeletal:        General: Normal range of motion.     Cervical back: Full passive range of motion without pain, normal range of motion and neck supple.     Lumbar back: Normal.     Right lower leg: No edema.     Left lower leg: No edema.  Lymphadenopathy:     Head:     Right side of head: No submental, submandibular, tonsillar, preauricular or posterior auricular adenopathy.     Left side of head: No submental, submandibular, tonsillar, preauricular or posterior auricular adenopathy.     Cervical: No cervical  adenopathy.  Skin:    General: Skin is warm and dry.     Capillary Refill: Capillary refill takes less than 2 seconds.     Findings: No rash.  Neurological:     Mental Status: He is alert and oriented to person, place, and time.     Cranial Nerves: Cranial nerves 2-12 are intact.     Motor: Motor function is intact.     Coordination: Coordination is intact.     Gait: Gait is intact.     Deep Tendon Reflexes: Reflexes are normal and symmetric.     Reflex Scores:      Brachioradialis reflexes are 2+ on the right side and 2+ on the left side.      Patellar reflexes are 2+ on the right side and 2+ on the left side. Psychiatric:        Attention and Perception: Attention normal.        Mood and Affect: Mood normal.        Speech: Speech normal.        Behavior: Behavior normal. Behavior is cooperative.        Thought Content: Thought content normal.        Cognition and Memory: Cognition normal.     Results for orders placed or performed in visit on 07/13/22  Culture, Group A Strep   Collection Time: 07/13/22 11:39 AM   Specimen: Throat  Result Value Ref Range   MICRO NUMBER: [id number]  SPECIMEN QUALITY: Adequate    SOURCE: THROAT    STATUS: FINAL    RESULT: No group A Streptococcus isolated       Assessment & Plan:   Problem List Items Addressed This Visit       Other   Encounter for examination required by Department of Transportation (DOT) - Primary   DOT Certificate provided x 1 years with glasses   Hearing test: Pass at 20 both ears on audiometry Vision: 20/20 R, 20/20 L, 20/20 Both with glasses Urine 1.010, Neg Protein, Neg Glucose, Neg Hematuria           Follow up plan: Return if symptoms worsen or fail to improve.   PATIENT COUNSELING:    Advised to avoid cigarette smoking.  I discussed with the patient that most people either abstain from alcohol or drink within safe limits (<=14/week and <=4 drinks/occasion for males, <=7/weeks and <= 3  drinks/occasion for females) and that the risk for alcohol disorders and other health effects rises proportionally with the number of drinks per week and how often a drinker exceeds daily limits.  Discussed cessation/primary prevention of drug use and availability of treatment for abuse.   Diet: Encouraged to adjust caloric intake to maintain  or achieve ideal body weight, to reduce intake of dietary saturated fat and total fat, to limit sodium intake by avoiding high sodium foods and not adding table salt, and to maintain adequate dietary potassium and calcium  preferably from fresh fruits, vegetables, and low-fat dairy products.    Stressed the importance of regular exercise  Injury prevention: Discussed safety belts, safety helmets, smoke detector, smoking near bedding or upholstery.   Dental health: Discussed importance of regular tooth brushing, flossing, and dental visits.    NEXT PREVENTATIVE PHYSICAL DUE IN 1 YEAR. Return if symptoms worsen or fail to improve.

## 2023-07-12 NOTE — Assessment & Plan Note (Addendum)
 DOT Certificate provided x 1 years with glasses   Hearing test: Pass at 20 both ears on audiometry Vision: 20/20 R, 20/20 L, 20/20 Both with glasses Urine 1.010, Neg Protein, Neg Glucose, Neg Hematuria

## 2023-07-14 ENCOUNTER — Telehealth: Payer: Self-pay | Admitting: *Deleted

## 2023-07-14 ENCOUNTER — Ambulatory Visit (INDEPENDENT_AMBULATORY_CARE_PROVIDER_SITE_OTHER): Admitting: *Deleted

## 2023-07-14 VITALS — Ht 65.0 in | Wt 170.0 lb

## 2023-07-14 DIAGNOSIS — Z0000 Encounter for general adult medical examination without abnormal findings: Secondary | ICD-10-CM | POA: Diagnosis not present

## 2023-07-14 DIAGNOSIS — Z1211 Encounter for screening for malignant neoplasm of colon: Secondary | ICD-10-CM

## 2023-07-14 NOTE — Patient Instructions (Signed)
 Mr. Brian Forbes , Thank you for taking time out of your busy schedule to complete your Annual Wellness Visit with me. I enjoyed our conversation and look forward to speaking with you again next year. I, as well as your care team,  appreciate your ongoing commitment to your health goals. Please review the following plan we discussed and let me know if I can assist you in the future. Your Game plan/ To Do List    Referrals: If you haven't heard from the office you've been referred to, please reach out to them at the phone provided.  Remember to update your tetanus vaccine Follow up Visits: Next Medicare AWV with our clinical staff: 07/16/24 @ 10:10   Have you seen your provider in the last 6 months (3 months if uncontrolled diabetes)? Yes Next Office Visit with your provider: 08/30/23   Clinician Recommendations:  Aim for 30 minutes of exercise or brisk walking, 6-8 glasses of water, and 5 servings of fruits and vegetables each day.       This is a list of the screening recommended for you and due dates:  Health Maintenance  Topic Date Due   Colon Cancer Screening  07/20/2021   DTaP/Tdap/Td vaccine (2 - Td or Tdap) 11/01/2021   COVID-19 Vaccine (3 - 2024-25 season) 10/09/2022   Flu Shot  09/08/2023   Medicare Annual Wellness Visit  07/13/2024   Pneumonia Vaccine  Completed   Hepatitis C Screening  Completed   Zoster (Shingles) Vaccine  Completed   HPV Vaccine  Aged Out   Meningitis B Vaccine  Aged Out    Advanced directives: (Declined) Advance directive discussed with you today. Even though you declined this today, please call our office should you change your mind, and we can give you the proper paperwork for you to fill out. Advance Care Planning is important because it:  [x]  Makes sure you receive the medical care that is consistent with your values, goals, and preferences  [x]  It provides guidance to your family and loved ones and reduces their decisional burden about whether or not  they are making the right decisions based on your wishes.

## 2023-07-14 NOTE — Telephone Encounter (Signed)
 Called pt was unable to lvm due to vm being full

## 2023-07-14 NOTE — Telephone Encounter (Signed)
 Preformed AWV.   Patient is overdue a colonoscopy. Patient stated the last time he was in the office he was told to contact GI. Patient stated that he never heard back from them. Patient wants a referral to a local GI office.

## 2023-07-14 NOTE — Progress Notes (Signed)
 Subjective:   Brian Forbes is a 68 y.o. who presents for a Medicare Wellness preventive visit.  As a reminder, Annual Wellness Visits don't include a physical exam, and some assessments may be limited, especially if this visit is performed virtually. We may recommend an in-person follow-up visit with your provider if needed.  Visit Complete: Virtual I connected with  Brian Forbes on 07/14/23 by a audio enabled telemedicine application and verified that I am speaking with the correct person using two identifiers.  Patient Location: Home  Provider Location: Home Office  I discussed the limitations of evaluation and management by telemedicine. The patient expressed understanding and agreed to proceed.  Vital Signs: Because this visit was a virtual/telehealth visit, some criteria may be missing or patient reported. Any vitals not documented were not able to be obtained and vitals that have been documented are patient reported.  VideoDeclined- This patient declined Librarian, academic. Therefore the visit was completed with audio only.  Persons Participating in Visit: Patient.  AWV Questionnaire: No: Patient Medicare AWV questionnaire was not completed prior to this visit.  Cardiac Risk Factors include: advanced age (>32men, >89 women);male gender;dyslipidemia;hypertension;Other (see comment), Risk factor comments: history of smoking     Objective:     Today's Vitals   07/14/23 1055  Weight: 170 lb (77.1 kg)  Height: 5\' 5"  (1.651 m)   Body mass index is 28.29 kg/m.     07/14/2023   11:09 AM 08/24/2021    8:59 AM 11/10/2020    9:06 AM 11/02/2020   11:06 AM 08/28/2016    5:38 PM 02/18/2016    9:44 AM  Advanced Directives  Does Patient Have a Medical Advance Directive? No No No No No No  Would patient like information on creating a medical advance directive? No - Patient declined    No - Patient declined     Current Medications  (verified) Outpatient Encounter Medications as of 07/14/2023  Medication Sig   albuterol  (VENTOLIN  HFA) 108 (90 Base) MCG/ACT inhaler INHALE 1 TO 2 PUFFS INTO LUNGS EVERY 6 HOURS AS NEEDED FOR WHEEZING AND SHORTNESS OF BREATH   amLODipine -benazepril  (LOTREL) 10-20 MG capsule Take 1 capsule by mouth every morning.   aspirin EC 325 MG tablet Take 325 mg by mouth daily.   atorvastatin  (LIPITOR) 10 MG tablet Take 1 tablet (10 mg total) by mouth every morning.   famotidine  (PEPCID ) 20 MG tablet Take 1 tablet (20 mg total) by mouth every evening.   fluticasone -salmeterol (WIXELA INHUB) 100-50 MCG/ACT AEPB Inhale 1 puff into the lungs 2 (two) times daily.   loratadine  (CLARITIN ) 10 MG tablet Take 1 tablet by mouth once daily   naproxen  (NAPROSYN ) 500 MG tablet Take 1 tablet (500 mg total) by mouth 2 (two) times daily with a meal. (Patient not taking: Reported on 07/14/2023)   predniSONE  (DELTASONE ) 20 MG tablet Take 2 tablets (40 mg total) by mouth daily with breakfast. (Patient not taking: Reported on 07/14/2023)   [DISCONTINUED] pantoprazole  (PROTONIX ) 20 MG tablet Take 2 tablets (40 mg total) by mouth every morning. Take 30 minutes to hour before breakfast (Patient not taking: Reported on 07/14/2023)   No facility-administered encounter medications on file as of 07/14/2023.    Allergies (verified) Patient has no known allergies.   History: Past Medical History:  Diagnosis Date   Arthritis    Asthma    GERD (gastroesophageal reflux disease)    Hyperlipidemia    Hypertension    Past  Surgical History:  Procedure Laterality Date   COLONOSCOPY     SHOULDER ARTHROSCOPY WITH SUBACROMIAL DECOMPRESSION, ROTATOR CUFF REPAIR AND BICEP TENDON REPAIR Right 11/10/2020   Procedure: SHOULDER ARTHROSCOPY WITH DEBRIDEMENT, DECOMPRESSION, PARTIAL THICKNESS ROTATOR CUFF REPAIR AND BICEP TENODESIS;  Surgeon: Elner Hahn, MD;  Location: [facility] ORS;  Service: Orthopedics;  Laterality: Right;   Family History  Problem  Relation Age of Onset   Stomach cancer Mother 24   Pancreatitis Father 29   Heart attack Sister 87   Colon cancer Neg Hx    Prostate cancer Neg Hx    Social History   Socioeconomic History   Marital status: Married    Spouse name: Not on file   Number of children: Not on file   Years of education: Not on file   Highest education level: Not on file  Occupational History   Not on file  Tobacco Use   Smoking status: Former    Current packs/day: 0.00    Average packs/day: 0.3 packs/day for 10.0 years (2.5 ttl pk-yrs)    Types: Cigarettes    Start date: 02/07/2009    Quit date: 02/08/2019    Years since quitting: 4.4   Smokeless tobacco: Never  Vaping Use   Vaping status: Never Used  Substance and Sexual Activity   Alcohol use: Yes    Comment: occ beer   Drug use: Never   Sexual activity: Not on file  Other Topics Concern   Not on file  Social History Narrative   From [place], [place]   He is family of 7 siblings total      Moved to his area due to his daughter   2 daughters - 50, 79   8 grandchildren, and 8 great grandchildren      Married , 49 years.   Retired from [employer]   Works part time at [employer] which he enjoys.    Social Drivers of Corporate investment banker Strain: Low Risk  (07/14/2023)   Overall Financial Resource Strain (CARDIA)    Difficulty of Paying Living Expenses: Not hard at all  Food Insecurity: No Food Insecurity (07/14/2023)   Hunger Vital Sign    Worried About Running Out of Food in the Last Year: Never true    Ran Out of Food in the Last Year: Never true  Transportation Needs: No Transportation Needs (07/14/2023)   PRAPARE - Administrator, Civil Service (Medical): No    Lack of Transportation (Non-Medical): No  Physical Activity: Inactive (07/14/2023)   Exercise Vital Sign    Days of Exercise per Week: 0 days    Minutes of Exercise per Session: 0 min  Stress: No Stress Concern Present (07/14/2023)   [employer] of Occupational  Health - Occupational Stress Questionnaire    Feeling of Stress : Not at all  Social Connections: Socially Integrated (07/14/2023)   Social Connection and Isolation Panel [NHANES]    Frequency of Communication with Friends and Family: More than three times a week    Frequency of Social Gatherings with Friends and Family: Three times a week    Attends Religious Services: More than 4 times per year    Active Member of Clubs or Organizations: Yes    Attends Banker Meetings: More than 4 times per year    Marital Status: Married    Tobacco Counseling Counseling given: Not Answered    Clinical Intake:  Pre-visit preparation completed: Yes  Pain : No/denies  pain     BMI - recorded: 28.29 Nutritional Status: BMI 25 -29 Overweight Nutritional Risks: None Diabetes: No  No results found for: "HGBA1C"   How often do you need to have someone help you when you read instructions, pamphlets, or other written materials from your doctor or pharmacy?: 1 - Never  Interpreter Needed?: No  Information entered by :: R. Tanika Bracco LPN   Activities of Daily Living     07/14/2023   10:56 AM  In your present state of health, do you have any difficulty performing the following activities:  Hearing? 0  Vision? 0  Comment glasses  Difficulty concentrating or making decisions? 0  Walking or climbing stairs? 0  Dressing or bathing? 0  Doing errands, shopping? 0  Preparing Food and eating ? N  Using the Toilet? N  In the past six months, have you accidently leaked urine? N  Do you have problems with loss of bowel control? N  Managing your Medications? N  Managing your Finances? N  Housekeeping or managing your Housekeeping? N    Patient Care Team: Calista Catching, FNP as PCP - General (Family Medicine) Molli Angelucci, MD as Consulting Physician (Orthopedic Surgery)  I have updated your Care Teams any recent Medical Services you may have received from other providers in the past  year.     Assessment:    This is a routine wellness examination for [facility].  Hearing/Vision screen Hearing Screening - Comments:: No issues Vision Screening - Comments:: glasses   Goals Addressed             This Visit's Progress    Patient Stated       Wants to try to exercise       Depression Screen     07/14/2023   11:04 AM 02/27/2023    1:42 PM 12/28/2022   10:36 AM 07/13/2022   10:02 AM 06/29/2022   10:41 AM  PHQ 2/9 Scores  PHQ - 2 Score 1 0 0 0 0  PHQ- 9 Score 1 0 0 0 0    Fall Risk     07/14/2023   10:58 AM 02/27/2023    1:42 PM 12/28/2022   10:36 AM 07/13/2022   10:01 AM 06/29/2022   10:37 AM  Fall Risk   Falls in the past year? 0 0 0 0 0  Number falls in past yr: 0 0 0 0 0  Injury with Fall? 0 0 0 0 0  Risk for fall due to : No Fall Risks No Fall Risks No Fall Risks No Fall Risks No Fall Risks  Follow up Falls evaluation completed;Falls prevention discussed Falls evaluation completed Falls evaluation completed Falls evaluation completed Falls evaluation completed    MEDICARE RISK AT HOME:  Medicare Risk at Home Any stairs in or around the home?: Yes If so, are there any without handrails?: No Home free of loose throw rugs in walkways, pet beds, electrical cords, etc?: Yes Adequate lighting in your home to reduce risk of falls?: Yes Life alert?: No Use of a cane, walker or w/c?: No Grab bars in the bathroom?: Yes Shower chair or bench in shower?: Yes Elevated toilet seat or a handicapped toilet?: Yes  TIMED UP AND GO:  Was the test performed?  No  Cognitive Function: 6CIT completed        07/14/2023   11:09 AM  6CIT Screen  What Year? 0 points  What month? 0 points  What time? 0 points  Count back from 20 0 points  Months in reverse 2 points  Repeat phrase 2 points  Total Score 4 points    Immunizations Immunization History  Administered Date(s) Administered   Fluad Quad(high Dose 65+) 11/17/2022   Influenza Inj Mdck Quad Pf  04/05/2021   Influenza, Quadrivalent, Recombinant, Inj, Pf 11/08/2018   Influenza, Seasonal, Injecte, Preservative Fre 01/19/2012, 01/09/2013   Influenza,inj,Quad PF,6+ Mos 10/24/2013, 12/22/2015, 11/22/2016   Influenza,inj,quad, With Preservative 11/05/2021   Influenza-Unspecified 11/02/2017   PFIZER(Purple Top)SARS-COV-2 Vaccination 04/04/2019, 04/25/2019   PNEUMOCOCCAL CONJUGATE-20 06/29/2022   Pneumococcal Conjugate-13 07/23/2019   Pneumococcal Polysaccharide-23 01/09/2013   Respiratory Syncytial Virus Vaccine,Recomb Aduvanted(Arexvy) 11/05/2021   Tdap 11/02/2011   Zoster Recombinant(Shingrix) 07/07/2016, 01/12/2017    Screening Tests Health Maintenance  Topic Date Due   Medicare Annual Wellness (AWV)  Never done   Colonoscopy  07/20/2021   DTaP/Tdap/Td (2 - Td or Tdap) 11/01/2021   COVID-19 Vaccine (3 - 2024-25 season) 10/09/2022   INFLUENZA VACCINE  09/08/2023   Pneumonia Vaccine 20+ Years old  Completed   Hepatitis C Screening  Completed   Zoster Vaccines- Shingrix  Completed   HPV VACCINES  Aged Out   Meningococcal B Vaccine  Aged Out    Health Maintenance  Health Maintenance Due  Topic Date Due   Medicare Annual Wellness (AWV)  Never done   Colonoscopy  07/20/2021   DTaP/Tdap/Td (2 - Td or Tdap) 11/01/2021   COVID-19 Vaccine (3 - 2024-25 season) 10/09/2022   Health Maintenance Items Addressed: Discussed the need to update Tetanus vaccine. Patient is overdue a colonoscopy will send note to PCP  Additional Screening:  Vision Screening: Recommended annual ophthalmology exams for early detection of glaucoma and other disorders of the eye. Would you like a referral to an eye doctor? Yes  Up tp date [facility]  Dental Screening: Recommended annual dental exams for proper oral hygiene  Community Resource Referral / Chronic Care Management: CRR required this visit?  No   CCM required this visit?  No   Plan:    I have personally reviewed and noted the  following in the patient's chart:   Medical and social history Use of alcohol, tobacco or illicit drugs  Current medications and supplements including opioid prescriptions. Patient is not currently taking opioid prescriptions. Functional ability and status Nutritional status Physical activity Advanced directives List of other physicians Hospitalizations, surgeries, and ER visits in previous 12 months Vitals Screenings to include cognitive, depression, and falls Referrals and appointments  In addition, I have reviewed and discussed with patient certain preventive protocols, quality metrics, and best practice recommendations. A written personalized care plan for preventive services as well as general preventive health recommendations were provided to patient.   Felicitas Horse, LPN   02/12/1094   After Visit Summary: (Pick Up) Due to this being a telephonic visit, with patients personalized plan was offered to patient and patient has requested to Pick up at office.  Notes: Nothing significant to report at this time. Sending phone note to PCP

## 2023-07-17 NOTE — Telephone Encounter (Signed)
 Called pt was unable to lvm due to vm being full referral was placed for GI in November 2024

## 2023-07-19 ENCOUNTER — Other Ambulatory Visit: Payer: Self-pay | Admitting: Family

## 2023-07-19 NOTE — Telephone Encounter (Signed)
 Spoke to pt rescheduled his appt for 09/01/23 pt was given number to [facility]

## 2023-07-19 NOTE — Addendum Note (Signed)
 Addended by: Rasaan Brotherton on: 07/19/2023 03:20 PM   Modules accepted: Orders

## 2023-08-12 ENCOUNTER — Other Ambulatory Visit: Payer: Self-pay | Admitting: Family

## 2023-08-16 DIAGNOSIS — M1A00X0 Idiopathic chronic gout, unspecified site, without tophus (tophi): Secondary | ICD-10-CM | POA: Diagnosis not present

## 2023-08-25 ENCOUNTER — Other Ambulatory Visit: Payer: Self-pay | Admitting: Family

## 2023-08-28 NOTE — Progress Notes (Signed)
 SDOH: positive for food insecurities, food pantries resources given

## 2023-08-29 NOTE — Telephone Encounter (Signed)
 UNABLE TO LVM TO INQUIRE ABOUT MESSAGE BELOW

## 2023-08-30 ENCOUNTER — Ambulatory Visit: Admitting: Family

## 2023-09-01 ENCOUNTER — Ambulatory Visit (INDEPENDENT_AMBULATORY_CARE_PROVIDER_SITE_OTHER): Admitting: Family

## 2023-09-01 ENCOUNTER — Encounter: Payer: Self-pay | Admitting: Family

## 2023-09-01 VITALS — BP 118/62 | HR 76 | Temp 98.1°F | Ht 65.0 in | Wt 171.0 lb

## 2023-09-01 DIAGNOSIS — Z122 Encounter for screening for malignant neoplasm of respiratory organs: Secondary | ICD-10-CM

## 2023-09-01 DIAGNOSIS — Z125 Encounter for screening for malignant neoplasm of prostate: Secondary | ICD-10-CM

## 2023-09-01 DIAGNOSIS — R053 Chronic cough: Secondary | ICD-10-CM

## 2023-09-01 DIAGNOSIS — I159 Secondary hypertension, unspecified: Secondary | ICD-10-CM | POA: Diagnosis not present

## 2023-09-01 DIAGNOSIS — M10071 Idiopathic gout, right ankle and foot: Secondary | ICD-10-CM | POA: Diagnosis not present

## 2023-09-01 DIAGNOSIS — Z1211 Encounter for screening for malignant neoplasm of colon: Secondary | ICD-10-CM

## 2023-09-01 DIAGNOSIS — E785 Hyperlipidemia, unspecified: Secondary | ICD-10-CM | POA: Diagnosis not present

## 2023-09-01 DIAGNOSIS — Z136 Encounter for screening for cardiovascular disorders: Secondary | ICD-10-CM

## 2023-09-01 MED ORDER — IPRATROPIUM BROMIDE 0.02 % IN SOLN: 0.5000 mg | Freq: Once | RESPIRATORY_TRACT | Status: AC

## 2023-09-01 MED ORDER — IPRATROPIUM-ALBUTEROL 0.5-2.5 (3) MG/3ML IN SOLN
3.0000 mL | Freq: Once | RESPIRATORY_TRACT | Status: AC
  Administered 2023-09-01: 3 mL via RESPIRATORY_TRACT

## 2023-09-01 MED ORDER — FLUTICASONE-SALMETEROL 100-50 MCG/ACT IN AEPB: 1.0000 | INHALATION_SPRAY | Freq: Two times a day (BID) | RESPIRATORY_TRACT | 2 refills | Status: DC

## 2023-09-01 MED ORDER — ALBUTEROL SULFATE (2.5 MG/3ML) 0.083% IN NEBU: 2.5000 mg | INHALATION_SOLUTION | Freq: Once | RESPIRATORY_TRACT | Status: DC

## 2023-09-01 MED ORDER — IPRATROPIUM-ALBUTEROL 0.5-2.5 (3) MG/3ML IN SOLN: RESPIRATORY_TRACT | 2 refills | Status: AC

## 2023-09-01 NOTE — Patient Instructions (Signed)
 Referral to social work for caregiver resources  Referral to  [facility] gastroenterology as due for colonoscopy Referral to lung cancer screening program  Let us  know if you dont hear back within a week in regards to an appointment being scheduled.   So that you are aware, if you are [facility] MyChart user , please pay attention to your MyChart messages as you may receive a MyChart message with a phone number to call and schedule this test/appointment own your own from our referral coordinator. This is a new process so I do not want you to miss this message.  If you are not a MyChart user, you will receive a phone call.    Please start DAILY preventative inhaler called Wixela; this has small amount of steriod in it that will likely reduce symptoms.   I prescribed nebulizer solution also.

## 2023-09-01 NOTE — Assessment & Plan Note (Signed)
 No acute respiratory distress.  He felt better after Duoneb and I have prescribed nebulizer and Duoneb medication.  He never started Shandon and I counseled him on daily preventative and rescue therapy.  Close follow up.  Referral replaced to CT lung cancer screen.  If symptoms do not improve, will refer to pulmonology for formal diagnosis.

## 2023-09-01 NOTE — Progress Notes (Unsigned)
 Assessment & Plan:  Hyperlipidemia, unspecified hyperlipidemia type  Chronic cough     Return precautions given.   Education regarding symptom management and diagnosis given to patient on AVS either electronically or printed.  No follow-ups on file.  Rollene Northern, FNP  Subjective:    Patient ID: Brian Forbes, male    DOB: [birth year], 68 y.o.   MRN: [id number]  CC: Brian Forbes is a 68 y.o. male who presents today for follow up.   HPI: He continues to complain of cough most mornings 2- 3AM in which cousghs, drinks water and then cough resolves. Cough may last 1-2 minutes. He uses his albuterol  inhaler daily.  He did not start Wixela No cough other times of the day.  Occasional wheezing when he has a coughing episode; coughing associated with chest tightness. Denies associated bitter taste in mouth, nasal congestion, vomiting,fever , chills.       Compliant protonix  40mg  every day  H/o GERD, former smoker ( quit 2021) Colonoscopy [facility] 2022, repeat in 3 years, Dr Rodger Phlegm  Allergies: Patient has no known allergies. Current Outpatient Medications on File Prior to Visit  Medication Sig Dispense Refill   albuterol  (VENTOLIN  HFA) 108 (90 Base) MCG/ACT inhaler INHALE 1 TO 2 PUFFS INTO LUNGS EVERY 6 HOURS AS NEEDED FOR WHEEZING AND FOR SHORTNESS OF BREATH 6.7 g 1   allopurinol (ZYLOPRIM) 100 MG tablet Take 100 mg by mouth daily.     amLODipine -benazepril  (LOTREL) 10-20 MG capsule Take 1 capsule by mouth every morning. 90 capsule 1   aspirin EC 325 MG tablet Take 325 mg by mouth daily.     atorvastatin  (LIPITOR) 10 MG tablet Take 1 tablet (10 mg total) by mouth every morning. 90 tablet 2   famotidine  (PEPCID ) 20 MG tablet Take 1 tablet (20 mg total) by mouth every evening. 90 tablet 3   fluticasone -salmeterol (WIXELA INHUB) 100-50 MCG/ACT AEPB Inhale 1 puff into the lungs 2 (two) times daily. 2 each 2   loratadine  (CLARITIN ) 10 MG tablet Take 1 tablet by  mouth once daily 90 tablet 0   pantoprazole  (PROTONIX ) 20 MG tablet Take 2 tablets (40 mg total) by mouth daily. 180 tablet 3   No current facility-administered medications on file prior to visit.    Review of Systems  Constitutional:  Negative for chills and fever.  HENT:  Negative for congestion, postnasal drip and sinus pain.   Respiratory:  Positive for cough, chest tightness and wheezing. Negative for shortness of breath.   Cardiovascular:  Negative for chest pain and palpitations.  Gastrointestinal:  Negative for nausea and vomiting.  Neurological:  Negative for headaches.      Objective:    BP 118/62   Pulse 76   Temp 98.1 F (36.7 C) (Oral)   Ht 5' 5 (1.651 m)   Wt 171 lb (77.6 kg)   SpO2 98%   BMI 28.46 kg/m  BP Readings from Last 3 Encounters:  09/01/23 118/62  08/26/23 102/72  07/12/23 112/62   Wt Readings from Last 3 Encounters:  09/01/23 171 lb (77.6 kg)  07/14/23 170 lb (77.1 kg)  07/12/23 170 lb 6.4 oz (77.3 kg)    Physical Exam Vitals reviewed.  Constitutional:      Appearance: He is well-developed.  HENT:     Head: Normocephalic and atraumatic.     Right Ear: Hearing, tympanic membrane, ear canal and external ear normal. No decreased hearing noted. No drainage, swelling or tenderness. No middle  ear effusion. Tympanic membrane is not injected, erythematous or bulging.     Left Ear: Hearing, tympanic membrane, ear canal and external ear normal. No decreased hearing noted. No drainage, swelling or tenderness.  No middle ear effusion. Tympanic membrane is not injected, erythematous or bulging.     Nose: Nose normal.     Right Sinus: No maxillary sinus tenderness or frontal sinus tenderness.     Left Sinus: No maxillary sinus tenderness or frontal sinus tenderness.     Mouth/Throat:     Pharynx: Uvula midline. No oropharyngeal exudate or posterior oropharyngeal erythema.     Tonsils: No tonsillar abscesses.  Eyes:     Conjunctiva/sclera: Conjunctivae  normal.  Cardiovascular:     Rate and Rhythm: Regular rhythm.     Heart sounds: Normal heart sounds.  Pulmonary:     Effort: Pulmonary effort is normal. No respiratory distress.     Breath sounds: No decreased air movement. Examination of the right-lower field reveals wheezing. Examination of the left-lower field reveals wheezing. Wheezing present. No rhonchi or rales.     Comments: Few expiratory wheezes BL base of lungs Lymphadenopathy:     Head:     Right side of head: No submental, submandibular, tonsillar, preauricular, posterior auricular or occipital adenopathy.     Left side of head: No submental, submandibular, tonsillar, preauricular, posterior auricular or occipital adenopathy.     Cervical: No cervical adenopathy.  Skin:    General: Skin is warm and dry.  Neurological:     Mental Status: He is alert.  Psychiatric:        Speech: Speech normal.        Behavior: Behavior normal.    Patient significantly better after albuterol  treatment. Lung sounds clear and increased

## 2023-09-04 ENCOUNTER — Telehealth: Payer: Self-pay

## 2023-09-04 NOTE — Telephone Encounter (Signed)
 Copied from CRM [id number]. Topic: Clinical - Prescription Issue >> Sep 04, 2023  2:48 PM Chiquita SQUIBB wrote: Reason for CRM: Patient states he took the paper to the pharmacy for the machine that he puts the solution into, and the pharmacy does not carry the machine. Patient is asking where he can get it at. They did give him the solution. Please advise the patient.

## 2023-09-04 NOTE — Telephone Encounter (Signed)
 Spoke to pt and informed him that he needs to take to medical supply store to see if insurance will cover. Pt verbalized understanding

## 2023-09-10 ENCOUNTER — Other Ambulatory Visit: Payer: Self-pay | Admitting: Family

## 2023-09-19 ENCOUNTER — Other Ambulatory Visit (INDEPENDENT_AMBULATORY_CARE_PROVIDER_SITE_OTHER)

## 2023-09-19 DIAGNOSIS — Z136 Encounter for screening for cardiovascular disorders: Secondary | ICD-10-CM | POA: Diagnosis not present

## 2023-09-19 DIAGNOSIS — E785 Hyperlipidemia, unspecified: Secondary | ICD-10-CM | POA: Diagnosis not present

## 2023-09-19 DIAGNOSIS — Z13220 Encounter for screening for lipoid disorders: Secondary | ICD-10-CM | POA: Diagnosis not present

## 2023-09-19 DIAGNOSIS — M10071 Idiopathic gout, right ankle and foot: Secondary | ICD-10-CM

## 2023-09-19 DIAGNOSIS — Z125 Encounter for screening for malignant neoplasm of prostate: Secondary | ICD-10-CM | POA: Diagnosis not present

## 2023-09-19 LAB — CBC WITH DIFFERENTIAL/PLATELET
Basophils Absolute: 0 K/uL (ref 0.0–0.1)
Basophils Relative: 0.4 % (ref 0.0–3.0)
Eosinophils Absolute: 0.1 K/uL (ref 0.0–0.7)
Eosinophils Relative: 2.7 % (ref 0.0–5.0)
HCT: 41.7 % (ref 39.0–52.0)
Hemoglobin: 13.8 g/dL (ref 13.0–17.0)
Lymphocytes Relative: 36.3 % (ref 12.0–46.0)
Lymphs Abs: 1.8 K/uL (ref 0.7–4.0)
MCHC: 33.2 g/dL (ref 30.0–36.0)
MCV: 87.9 fl (ref 78.0–100.0)
Monocytes Absolute: 0.5 K/uL (ref 0.1–1.0)
Monocytes Relative: 11 % (ref 3.0–12.0)
Neutro Abs: 2.4 K/uL (ref 1.4–7.7)
Neutrophils Relative %: 49.6 % (ref 43.0–77.0)
Platelets: 210 K/uL (ref 150.0–400.0)
RBC: 4.75 Mil/uL (ref 4.22–5.81)
RDW: 13.9 % (ref 11.5–15.5)
WBC: 4.9 K/uL (ref 4.0–10.5)

## 2023-09-19 LAB — LIPID PANEL
Cholesterol: 129 mg/dL (ref 0–200)
HDL: 44.7 mg/dL (ref >39.00)
LDL Cholesterol: 68 mg/dL (ref 0–99)
NonHDL: 84.13
Total CHOL/HDL Ratio: 3
Triglycerides: 79 mg/dL (ref 0.0–149.0)
VLDL: 15.8 mg/dL (ref 0.0–40.0)

## 2023-09-19 LAB — COMPREHENSIVE METABOLIC PANEL WITH GFR
ALT: 18 U/L (ref 0–53)
AST: 19 U/L (ref 0–37)
Albumin: 3.9 g/dL (ref 3.5–5.2)
Alkaline Phosphatase: 59 U/L (ref 39–117)
BUN: 8 mg/dL (ref 6–23)
CO2: 27 meq/L (ref 19–32)
Calcium: 8.6 mg/dL (ref 8.4–10.5)
Chloride: 107 meq/L (ref 96–112)
Creatinine, Ser: 1.24 mg/dL (ref 0.40–1.50)
GFR: 59.3 mL/min — ABNORMAL LOW (ref >60.00)
Glucose, Bld: 107 mg/dL — ABNORMAL HIGH (ref 70–99)
Potassium: 4 meq/L (ref 3.5–5.1)
Sodium: 142 meq/L (ref 135–145)
Total Bilirubin: 0.3 mg/dL (ref 0.2–1.2)
Total Protein: 6.7 g/dL (ref 6.0–8.3)

## 2023-09-19 LAB — URIC ACID: Uric Acid, Serum: 7.4 mg/dL (ref 4.0–7.8)

## 2023-09-20 LAB — PSA: PSA: 1.14 ng/mL (ref <=4.00)

## 2023-09-21 ENCOUNTER — Ambulatory Visit: Payer: Self-pay | Admitting: Family

## 2023-09-26 DIAGNOSIS — Z008 Encounter for other general examination: Secondary | ICD-10-CM | POA: Diagnosis not present

## 2023-09-26 DIAGNOSIS — F17211 Nicotine dependence, cigarettes, in remission: Secondary | ICD-10-CM | POA: Diagnosis not present

## 2023-09-26 DIAGNOSIS — E663 Overweight: Secondary | ICD-10-CM | POA: Diagnosis not present

## 2023-09-26 DIAGNOSIS — Z6828 Body mass index (BMI) 28.0-28.9, adult: Secondary | ICD-10-CM | POA: Diagnosis not present

## 2023-09-26 DIAGNOSIS — J4540 Moderate persistent asthma, uncomplicated: Secondary | ICD-10-CM | POA: Diagnosis not present

## 2023-09-26 DIAGNOSIS — Z730 Burn-out: Secondary | ICD-10-CM | POA: Diagnosis not present

## 2023-09-27 ENCOUNTER — Telehealth: Payer: Self-pay

## 2023-09-27 NOTE — Progress Notes (Signed)
 Complex Care Management Note Care Guide Note  09/27/2023 Name: Brian Forbes MRN: [id number] DOB: [birth year]   Complex Care Management Outreach Attempts: An unsuccessful telephone outreach was attempted today to offer the patient information about available complex care management services.  Follow Up Plan:  Additional outreach attempts will be made to offer the patient complex care management information and services.   Encounter Outcome:  No Answer  Dreama [facility]  [facility], [facility] VBCI Assistant Direct Dial: [phone]  Fax: [phone]

## 2023-09-28 ENCOUNTER — Other Ambulatory Visit: Payer: Self-pay | Admitting: Family

## 2023-09-29 NOTE — Progress Notes (Signed)
 Complex Care Management Note Care Guide Note  09/29/2023 Name: Brian Forbes MRN: [id number] DOB: 27-Jul-1955   Complex Care Management Outreach Attempts: A second unsuccessful outreach was attempted today to offer the patient with information about available complex care management services.  Follow Up Plan:  Additional outreach attempts will be made to offer the patient complex care management information and services.   Encounter Outcome:  No Answer  Dreama [facility]  [facility], [facility] VBCI Assistant Direct Dial: [phone]  Fax: [phone]

## 2023-10-02 NOTE — Progress Notes (Signed)
 Complex Care Management Note Care Guide Note  10/02/2023 Name: Brian Forbes MRN: [id number] DOB: Oct 16, 1955   Complex Care Management Outreach Attempts: A third unsuccessful outreach was attempted today to offer the patient with information about available complex care management services.  Follow Up Plan:  No further outreach attempts will be made at this time. We have been unable to contact the patient to offer or enroll patient in complex care management services.  Encounter Outcome:  No Answer  Dreama [facility]  [facility], [facility] VBCI Assistant Direct Dial: [phone]  Fax: [phone]

## 2023-10-24 ENCOUNTER — Other Ambulatory Visit: Payer: Self-pay | Admitting: Family

## 2023-11-02 ENCOUNTER — Ambulatory Visit: Admitting: Family

## 2023-11-03 NOTE — Progress Notes (Signed)
 Pt attended 08/28/2023 screening event with BP of 118/62 with a elevated blood sugar & A1C, pt is diabetic. Pt noted at event that he does have a PCP. At event pt did not indicate any SDOH needs. Pt also noted that he is not a smoker and listed Medicaid as his insurance at the event.  Per initial f/u pt was reached out via phone and was unable to leave a vm due to vm being full. Pt was mailed letter with food SDOH resources & [facility] Free Cooking Class flyer.  Per chart review pt does have a PCP Jessie MATSU. Ar[place]; [place] [employer] [facility]), insurance, and is a former smoker. Pt's last appt with PCP was 11/02/2023 and has an upcoming appt on 12/22/2023. Pt does indicate food SDOH needs at this time.  Additional pt f/u to be scheduled at this time per health equity protocol.

## 2023-11-20 ENCOUNTER — Other Ambulatory Visit: Payer: Self-pay | Admitting: Family

## 2023-11-27 ENCOUNTER — Telehealth: Payer: Self-pay

## 2023-11-27 ENCOUNTER — Other Ambulatory Visit: Payer: Self-pay

## 2023-11-27 MED ORDER — ALBUTEROL SULFATE HFA 108 (90 BASE) MCG/ACT IN AERS: INHALATION_SPRAY | RESPIRATORY_TRACT | 1 refills | Status: DC

## 2023-11-27 NOTE — Telephone Encounter (Signed)
 Called pt to Inquire about telephone note in regards to Albuterol , to triage and ask if pt is taking Wixela. But was unable to LVM due to vm being full

## 2023-11-30 ENCOUNTER — Other Ambulatory Visit: Payer: Self-pay | Admitting: Family

## 2023-11-30 DIAGNOSIS — R053 Chronic cough: Secondary | ICD-10-CM

## 2023-11-30 NOTE — Telephone Encounter (Signed)
 APPT has been scheduled for 12/22/23

## 2023-11-30 NOTE — Telephone Encounter (Unsigned)
 Copied from CRM [id number]. Topic: Clinical - Medication Refill >> Nov 30, 2023 10:17 AM Franky GRADE wrote: Medication: atorvastatin  (LIPITOR) 10 MG tablet [[id number]], famotidine  (PEPCID ) 20 MG tablet [[id number]],$MzfnczAzqnmzIZPI_rHWbgNhCVjTdaLKVbKDrOMkHfMasuDjK$$MzfnczAzqnmzIZPI_rHWbgNhCVjTdaLKVbKDrOMkHfMasuDjK$ -benazepril  (LOTREL) 10-20 MG capsule [[id number]]  Has the patient contacted their pharmacy? Yes, [facility] calling to [facility] the request.  (Agent: If no, request that the patient contact the pharmacy for the refill. If patient does not wish to contact the pharmacy document the reason why and proceed with request.) (Agent: If yes, when and what did the pharmacy advise?)  This is the patient's preferred pharmacy:   [facility] [place] [address] [place] [address] Phone: [phone] Fax: [phone]  Is this the correct pharmacy for this prescription? Yes If no, delete pharmacy and type the correct one.   Has the prescription been filled recently? No  Is the patient out of the medication? No, the patient has two days worth of medication.   Has the patient been seen for an appointment in the last year OR does the patient have an upcoming appointment? Yes  Can we respond through MyChart? No  Agent: Please be advised that Rx refills may take up to 3 business days. We ask that you follow-up with your pharmacy.

## 2023-12-01 MED ORDER — ATORVASTATIN CALCIUM 10 MG PO TABS: 10.0000 mg | ORAL_TABLET | Freq: Every morning | ORAL | 0 refills | Status: DC

## 2023-12-01 MED ORDER — AMLODIPINE BESY-BENAZEPRIL HCL 10-20 MG PO CAPS: ORAL_CAPSULE | ORAL | 3 refills | Status: AC

## 2023-12-01 MED ORDER — FAMOTIDINE 20 MG PO TABS: 20.0000 mg | ORAL_TABLET | Freq: Every evening | ORAL | 0 refills | Status: DC

## 2023-12-08 ENCOUNTER — Other Ambulatory Visit: Payer: Self-pay | Admitting: Family

## 2023-12-08 NOTE — Telephone Encounter (Signed)
 Copied from CRM [id number]. Topic: Clinical - Medication Refill >> Dec 08, 2023  1:15 PM Shereese L wrote: Medication: amLODipine -benazepril  (LOTREL) 10-20 MG capsule  Has the patient contacted their pharmacy? Yes (Agent: If no, request that the patient contact the pharmacy for the refill. If patient does not wish to contact the pharmacy document the reason why and proceed with request.) (Agent: If yes, when and what did the pharmacy advise?)  This is the patient's preferred pharmacy:   [facility] [place] [address] [place] [address] Phone: [phone] Fax: [phone]  Is this the correct pharmacy for this prescription? Yes If no, delete pharmacy and type the correct one.   Has the prescription been filled recently? Yes  Is the patient out of the medication? Yes  Has the patient been seen for an appointment in the last year OR does the patient have an upcoming appointment? Yes  Can we respond through MyChart? Yes  Agent: Please be advised that Rx refills may take up to 3 business days. We ask that you follow-up with your pharmacy.

## 2023-12-08 NOTE — Telephone Encounter (Signed)
 Copied from CRM [id number]. Topic: Clinical - Medication Refill >> Dec 08, 2023 10:18 AM Tiffini S wrote: Medication:  atorvastatin  (LIPITOR) 10 MG tablet   Has the patient contacted their pharmacy? Yes (Agent: If no, request that the patient contact the pharmacy for the refill. If patient does not wish to contact the pharmacy document the reason why and proceed with request.) (Agent: If yes, when and what did the pharmacy advise?)  This is the patient's preferred pharmacy:   [facility] [place] [address] [place] [address] Phone: [phone] Fax: [phone]  Is this the correct pharmacy for this prescription? Yes If no, delete pharmacy and type the correct one.   Has the prescription been filled recently? Yes  Is the patient out of the medication? Yes  Has the patient been seen for an appointment in the last year OR does the patient have an upcoming appointment? Yes  Can we respond through MyChart? No, please call the patient at [phone]  Agent: Please be advised that Rx refills may take up to 3 business days. We ask that you follow-up with your pharmacy.

## 2023-12-12 MED ORDER — ATORVASTATIN CALCIUM 10 MG PO TABS: 10.0000 mg | ORAL_TABLET | Freq: Every morning | ORAL | 3 refills | Status: DC

## 2023-12-14 ENCOUNTER — Ambulatory Visit (INDEPENDENT_AMBULATORY_CARE_PROVIDER_SITE_OTHER): Admitting: Family

## 2023-12-14 ENCOUNTER — Telehealth: Payer: Self-pay | Admitting: Family

## 2023-12-14 ENCOUNTER — Encounter: Payer: Self-pay | Admitting: Family

## 2023-12-14 VITALS — BP 120/70 | HR 83 | Temp 98.0°F | Ht 65.0 in | Wt 170.8 lb

## 2023-12-14 DIAGNOSIS — R053 Chronic cough: Secondary | ICD-10-CM

## 2023-12-14 DIAGNOSIS — E785 Hyperlipidemia, unspecified: Secondary | ICD-10-CM

## 2023-12-14 DIAGNOSIS — F320 Major depressive disorder, single episode, mild: Secondary | ICD-10-CM | POA: Diagnosis not present

## 2023-12-14 DIAGNOSIS — I159 Secondary hypertension, unspecified: Secondary | ICD-10-CM

## 2023-12-14 DIAGNOSIS — Z87891 Personal history of nicotine dependence: Secondary | ICD-10-CM

## 2023-12-14 LAB — MICROALBUMIN / CREATININE URINE RATIO
Creatinine,U: 90.4 mg/dL
Microalb Creat Ratio: UNDETERMINED mg/g (ref 0.0–30.0)
Microalb, Ur: <0.7 mg/dL

## 2023-12-14 MED ORDER — FLUOXETINE HCL 20 MG PO CAPS: 20.0000 mg | ORAL_CAPSULE | Freq: Every morning | ORAL | 3 refills | Status: AC

## 2023-12-14 MED ORDER — IPRATROPIUM-ALBUTEROL 0.5-2.5 (3) MG/3ML IN SOLN: 3.0000 mL | Freq: Once | RESPIRATORY_TRACT | Status: AC

## 2023-12-14 MED ORDER — ATORVASTATIN CALCIUM 10 MG PO TABS
10.0000 mg | ORAL_TABLET | Freq: Every morning | ORAL | 3 refills | Status: AC
Start: 2023-12-14 — End: ?

## 2023-12-14 MED ORDER — BUDESONIDE-FORMOTEROL FUMARATE 80-4.5 MCG/ACT IN AERO: 2.0000 | INHALATION_SPRAY | Freq: Two times a day (BID) | RESPIRATORY_TRACT | 3 refills | Status: DC

## 2023-12-14 NOTE — Telephone Encounter (Signed)
 Spoke to Brian Forbes at [facility] and she states that they do have them the cost is $40 can not be billed through insurance. Spoke to pt he states that he can go get that one

## 2023-12-14 NOTE — Telephone Encounter (Signed)
 Call [facility]  Pt needs nebulizer machine for chronic cough and former smoker  How much will it cost?  Can we fax order?

## 2023-12-14 NOTE — Progress Notes (Signed)
 Assessment & Plan:  Chronic cough Assessment & Plan: Suspect COPD.  No formal evaluation by pulmonology. Symptom has been waxing and waning.  Unable to pick up nebulizer.  [facility] patient will be able to afford nebulizer machine (costs $40).  Provided DuoNeb in the office with improvement of symptoms.  Represcribed Symbicort  to see if covered.  Close follow-up.  Again plan to discuss pulmonology consult and consideration of echocardiogram due to chronic nature of cough.  Referral as well to Pharm.D. due to cost of inhalers in the past  Orders: -     AMB Referral VBCI Care Management -     CBC with Differential/Platelet -     For home use only DME Nebulizer machine -     Budesonide -Formoterol  Fumarate; Inhale 2 puffs into the lungs 2 (two) times daily.  Dispense: 1 each; Refill: 3 -     Ipratropium-Albuterol   Hyperlipidemia, unspecified hyperlipidemia type Assessment & Plan: No h/o MI, CVA.  Unsure why patient would be taking 325 mg of aspirin daily.  Concern for risk of bleeding.  Advised to take 81 mg of aspirin, updated on chart.  Orders: -     Atorvastatin  Calcium ; Take 1 tablet (10 mg total) by mouth every morning.  Dispense: 90 tablet; Refill: 3  Depression, major, single episode, mild Assessment & Plan: New. Wife with recent diagnosis of breast cancer, requiring full-time care at home. Start Prozac 20 mg and titrate.  Referral for counseling.  Close follow up.   Orders: -     Ambulatory referral to Psychology -     FLUoxetine HCl; Take 1 capsule (20 mg total) by mouth every morning.  Dispense: 30 capsule; Refill: 3 -     Basic metabolic panel with GFR  Secondary hypertension -     Basic metabolic panel with GFR -     Microalbumin / creatinine urine ratio  Former smoker Assessment & Plan: Patient has been long overdue for CT lung cancer screening.  Difficult getting scheduled.  Recommended many times in the past to patient.  He is not a caregiver  role.  Plan to discuss again at follow-up      Return precautions given.   Risks, benefits, and alternatives of the medications and treatment plan prescribed today were discussed, and patient expressed understanding.   Education regarding symptom management and diagnosis given to patient on AVS either electronically or printed.  Return in about 2 weeks (around 12/28/2023).  Rollene Northern, FNP  Subjective:    Patient ID: Brian Forbes, male    DOB: [birth year], 68 y.o.   MRN: [id number]  CC: Brian Forbes is a 68 y.o. male who presents today for follow up.   HPI: HPI Follow-up chronic cough from July of this year  Discussed the use of AI scribe software for clinical note transcription with the patient, who gave verbal consent to proceed.  History of Present Illness   Brian Forbes is a 68 year old male who presents with a persistent dry cough and stress related to caregiving responsibilities.  He has a persistent dry cough that primarily occurs at night. This cough had previously resolved but has since returned. He has not yet obtained a nebulizer machine due to cost.    He uses an albuterol  inhaler as needed, approximately once or twice a day, which provides relief. He experiences shortness of breath at night when lying down. Denies orthopnea, leg swelling, chest pain.  He is experiencing  significant stress due to his wife's recent breast cancer diagnosis and her need for full-time care. His wife also has a history of back surgery three years ago, from which she has not fully recovered, requiring the use of a walker.  He feels overwhelmed by his caregiving responsibilities and has had to put his own medical appointments aside to care for her.  He feels overwhelmed and stressed caring for his wife.  He experiences feelings of depression, difficulty concentrating, and a lack of appetite.  Denies thoughts of hurting himself or anyone else   He has been married  for fifty years and finds it difficult to witness his wife's health decline.  He has been taking aspirin for over twenty years due to a past heart issue, but he is unsure of the current dosage.   He has not been able to obtain the prescribed Wixela inhaler due to cost.  His daughter, who lives nearby, assists with caregiving when possible, but he feels the burden of care is primarily on him.     Due colonoscopy Former smoker  No history of heart valve replacement, stent.  No h/o GIB  Allergies: Patient has no known allergies. Current Outpatient Medications on File Prior to Visit  Medication Sig Dispense Refill   aspirin EC 81 MG tablet Take 81 mg by mouth daily. Swallow whole.     albuterol  (VENTOLIN  HFA) 108 (90 Base) MCG/ACT inhaler Inhale 1 to 2 puffs by mouth into lungs every 6 hours as needed for wheezing and for shortness of breath 6.7 g 1   allopurinol (ZYLOPRIM) 100 MG tablet Take 100 mg by mouth daily.     amLODipine -benazepril  (LOTREL) 10-20 MG capsule TAKE ONE CAPSULE BY MOUTH DAILY AT 9AM IN THE MORNING 90 capsule 3   famotidine  (PEPCID ) 20 MG tablet Take 1 tablet (20 mg total) by mouth every evening. 90 tablet 0   ipratropium-albuterol  (DUONEB) 0.5-2.5 (3) MG/3ML SOLN Oral inhalation: 1 vial every 4 to 6 hours as needed (maximum: 6 vials/day) (Ref). 360 mL 2   loratadine  (CLARITIN ) 10 MG tablet Take 1 tablet by mouth once daily 90 tablet 0   pantoprazole  (PROTONIX ) 20 MG tablet Take 2 tablets (40 mg total) by mouth daily. 180 tablet 3   Current Facility-Administered Medications on File Prior to Visit  Medication Dose Route Frequency Provider Last Rate Last Admin   ipratropium (ATROVENT ) nebulizer solution 0.5 mg  0.5 mg Nebulization Once Dontrelle Mazon G, FNP        Review of Systems  Constitutional:  Negative for chills and fever.  HENT:  Negative for congestion and sinus pressure.   Respiratory:  Positive for cough. Negative for shortness of breath.    Cardiovascular:  Negative for chest pain, palpitations and leg swelling.  Gastrointestinal:  Negative for nausea and vomiting.      Objective:    BP 120/70   Pulse 83   Temp 98 F (36.7 C) (Oral)   Ht 5' 5 (1.651 m)   Wt 170 lb 12.8 oz (77.5 kg)   SpO2 96%   BMI 28.42 kg/m  BP Readings from Last 3 Encounters:  12/14/23 120/70  09/01/23 118/62  08/26/23 102/72   Wt Readings from Last 3 Encounters:  12/14/23 170 lb 12.8 oz (77.5 kg)  09/01/23 171 lb (77.6 kg)  07/14/23 170 lb (77.1 kg)      12/14/2023    1:16 PM 09/01/2023    8:33 AM 07/14/2023   11:04 AM  Depression screen  PHQ 2/9  Decreased Interest 0 0 0  Down, Depressed, Hopeless 0 0 1  PHQ - 2 Score 0 0 1  Altered sleeping  0 0  Tired, decreased energy  0 0  Change in appetite  0 0  Feeling bad or failure about yourself   0 0  Trouble concentrating  0 0  Moving slowly or fidgety/restless  0 0  Suicidal thoughts  0 0  PHQ-9 Score  0  1   Difficult doing work/chores  Not difficult at all Not difficult at all     Data saved with a previous flowsheet row definition    Physical Exam Vitals reviewed.  Constitutional:      Appearance: He is well-developed.  HENT:     Head: Normocephalic and atraumatic.     Right Ear: Hearing, tympanic membrane, ear canal and external ear normal. No decreased hearing noted. No drainage, swelling or tenderness. No middle ear effusion. Tympanic membrane is not injected, erythematous or bulging.     Left Ear: Hearing, tympanic membrane, ear canal and external ear normal. No decreased hearing noted. No drainage, swelling or tenderness.  No middle ear effusion. Tympanic membrane is not injected, erythematous or bulging.     Nose: Nose normal.     Right Sinus: No maxillary sinus tenderness or frontal sinus tenderness.     Left Sinus: No maxillary sinus tenderness or frontal sinus tenderness.     Mouth/Throat:     Pharynx: Uvula midline. No oropharyngeal exudate or posterior  oropharyngeal erythema.     Tonsils: No tonsillar abscesses.  Eyes:     Conjunctiva/sclera: Conjunctivae normal.  Cardiovascular:     Rate and Rhythm: Regular rhythm.     Heart sounds: Normal heart sounds.  Pulmonary:     Effort: Pulmonary effort is normal. No respiratory distress.     Breath sounds: Normal breath sounds. No wheezing, rhonchi or rales.  Lymphadenopathy:     Head:     Right side of head: No submental, submandibular, tonsillar, preauricular, posterior auricular or occipital adenopathy.     Left side of head: No submental, submandibular, tonsillar, preauricular, posterior auricular or occipital adenopathy.     Cervical: No cervical adenopathy.  Skin:    General: Skin is warm and dry.  Neurological:     Mental Status: He is alert.  Psychiatric:        Speech: Speech normal.        Behavior: Behavior normal.    Patient felt significantly better after duoneb  treatment. Lung sounds remain course, increased.  Wheezing resolved.

## 2023-12-14 NOTE — Assessment & Plan Note (Addendum)
 No h/o MI, CVA.  Unsure why patient would be taking 325 mg of aspirin daily.  Concern for risk of bleeding.  Advised to take 81 mg of aspirin, updated on chart.

## 2023-12-14 NOTE — Assessment & Plan Note (Addendum)
 Suspect COPD.  No formal evaluation by pulmonology. Symptom has been waxing and waning.  Unable to pick up nebulizer.  [facility] patient will be able to afford nebulizer machine (costs $40).  Provided DuoNeb in the office with improvement of symptoms.  Represcribed Symbicort  to see if covered.  Close follow-up.  Again plan to discuss pulmonology consult and consideration of echocardiogram due to chronic nature of cough.  Referral as well to Pharm.D. due to cost of inhalers in the past

## 2023-12-14 NOTE — Assessment & Plan Note (Addendum)
 New. Wife with recent diagnosis of breast cancer, requiring full-time care at home. Start Prozac 20 mg and titrate.  Referral for counseling.  Close follow up.

## 2023-12-14 NOTE — Assessment & Plan Note (Signed)
 Patient has been long overdue for CT lung cancer screening.  Difficult getting scheduled.  Recommended many times in the past to patient.  He is not a caregiver role.  Plan to discuss again at follow-up

## 2023-12-14 NOTE — Progress Notes (Signed)
 The patient attended a screening event on 08/28/2023 where his screening results were a BP of 102/72. At the event the patient noted Dr. Rollene as his PCP, Medicare as his insurance, and did not indicate his smoking status. Patient indicated food SDOH need at the time of the event, patient was given food resources.   Per chart review patient has Rollene Northern, FNP as his PCP, has [employer] for insurance, and has a food SDOH need. Chart review also shows patients last appt with his PCP was on 12/14/2023 and has another upcoming appt with her on 12/22/2023.  CHW was unable to reach patient for f/u and unable to leave a vm. Letter sent along with food resources in case needed by patient. An additional follow up will be done in according to the health equity team's protocol.

## 2023-12-14 NOTE — Patient Instructions (Addendum)
 I do not recommend aspirin 325mg  unless history of valve replacement; please check and ensure aspirin 81mg . Please let me know what dose you are taking.   Call to schedule colonoscopy at [facility]  I have resent an inhaler called Symbicort  to your pharmacy for chronic cough.  This does have a low-dose steroid component.  I would like for you to stay on this medication daily to see if symptoms improve.  I have also sent in Prozac for depression and anxiety.  Please take this in the morning.  Will follow-up in 6 weeks time, sooner if needed.  This may be a virtual visit if you would like  Please pick up nebulizer and Symbicort    [email or web address] in [place], [place] [address] [place], [place], [place] [address]  20 mi Open  Closes 7 PM  More hours [phone]

## 2023-12-15 LAB — CBC WITH DIFFERENTIAL/PLATELET
Basophils Absolute: 0.1 K/uL (ref 0.0–0.1)
Basophils Relative: 1 % (ref 0.0–3.0)
Eosinophils Absolute: 0.4 K/uL (ref 0.0–0.7)
Eosinophils Relative: 5.1 % — ABNORMAL HIGH (ref 0.0–5.0)
HCT: 43.9 % (ref 39.0–52.0)
Hemoglobin: 14.7 g/dL (ref 13.0–17.0)
Lymphocytes Relative: 31.9 % (ref 12.0–46.0)
Lymphs Abs: 2.4 K/uL (ref 0.7–4.0)
MCHC: 33.6 g/dL (ref 30.0–36.0)
MCV: 89 fl (ref 78.0–100.0)
Monocytes Absolute: 0.8 K/uL (ref 0.1–1.0)
Monocytes Relative: 10.1 % (ref 3.0–12.0)
Neutro Abs: 3.9 K/uL (ref 1.4–7.7)
Neutrophils Relative %: 51.9 % (ref 43.0–77.0)
Platelets: 237 K/uL (ref 150.0–400.0)
RBC: 4.93 Mil/uL (ref 4.22–5.81)
RDW: 15.1 % (ref 11.5–15.5)
WBC: 7.5 K/uL (ref 4.0–10.5)

## 2023-12-15 LAB — BASIC METABOLIC PANEL WITH GFR
BUN: 12 mg/dL (ref 6–23)
CO2: 26 meq/L (ref 19–32)
Calcium: 9.4 mg/dL (ref 8.4–10.5)
Chloride: 104 meq/L (ref 96–112)
Creatinine, Ser: 1.43 mg/dL (ref 0.40–1.50)
GFR: 49.89 mL/min — ABNORMAL LOW
Glucose, Bld: 90 mg/dL (ref 70–99)
Potassium: 4.3 meq/L (ref 3.5–5.1)
Sodium: 142 meq/L (ref 135–145)

## 2023-12-19 ENCOUNTER — Ambulatory Visit: Payer: Self-pay | Admitting: Family

## 2023-12-19 ENCOUNTER — Telehealth: Payer: Self-pay

## 2023-12-19 DIAGNOSIS — N1831 Chronic kidney disease, stage 3a: Secondary | ICD-10-CM

## 2023-12-19 DIAGNOSIS — R899 Unspecified abnormal finding in specimens from other organs, systems and tissues: Secondary | ICD-10-CM

## 2023-12-19 NOTE — Progress Notes (Signed)
 Complex Care Management Note  Care Guide Note 12/19/2023 Name: Brian Forbes MRN: [id number] DOB: 23-Jul-1955  Brian Forbes is a 68 y.o. year old male who sees Arnett, Rollene MATSU, FNP for primary care. I reached out to [name] by phone today to offer complex care management services.  [name] was given information about Complex Care Management services today including:   The Complex Care Management services include support from the care team which includes your Nurse Care Manager, Clinical Social Worker, or Pharmacist.  The Complex Care Management team is here to help remove barriers to the health concerns and goals most important to you. Complex Care Management services are voluntary, and the patient may decline or stop services at any time by request to their care team member.   Complex Care Management Consent Status: Patient agreed to services and verbal consent obtained.   Follow up plan:  Telephone appointment with complex care management team member scheduled for:  12/26/23 at 11:00 a.m.   Encounter Outcome:  Patient Scheduled  Dreama [facility]  [facility] VBCI Assistant Direct Dial: [phone]  Fax: [phone]

## 2023-12-20 NOTE — Telephone Encounter (Signed)
 Copied from CRM [id number]. Topic: Clinical - Lab/Test Results >> Dec 20, 2023  2:40 PM Brian Forbes wrote: Reason for CRM: pt called due to missed call by  Brian Forbes, Brian Forbes, CMA, relayed message to pt however pt does have questions and would like a in debt of results as I told him I was not trained in that area. Pt says he also stopped taking  aspirin 325 mg . Please reach out to pt at earliest convenience to discuss lab results in debt.

## 2023-12-21 NOTE — Telephone Encounter (Signed)
 Copied from CRM #[id number]. Topic: Clinical - Lab/Test Results >> Dec 21, 2023 11:21 AM Alfonso HERO wrote: Reason for CRM: patient returning call I informed that I will send a message for someone to call him again.  I spoke with patient and transferred him to Jenate Jordan, CMA.

## 2023-12-21 NOTE — Telephone Encounter (Signed)
 Copied from CRM #[id number]. Topic: Clinical - Lab/Test Results >> Dec 21, 2023 11:20 AM Alfonso HERO wrote: Reason for CRM: patient calling Anice, Tymon Nemetz back. I informed I would send another message.

## 2023-12-22 ENCOUNTER — Ambulatory Visit: Admitting: Family

## 2023-12-26 ENCOUNTER — Other Ambulatory Visit: Admitting: Pharmacist

## 2023-12-26 ENCOUNTER — Other Ambulatory Visit (HOSPITAL_COMMUNITY): Payer: Self-pay

## 2023-12-26 ENCOUNTER — Telehealth: Payer: Self-pay

## 2023-12-26 DIAGNOSIS — J4530 Mild persistent asthma, uncomplicated: Secondary | ICD-10-CM

## 2023-12-26 NOTE — Telephone Encounter (Signed)
 Pharmacy Patient Advocate Encounter  Insurance verification completed.   The patient is insured through [employer]   Ran test claim for [facility]. Currently a quantity of 60 is a 30 day supply and the co-pay is $5 .   This test claim was processed through [facility]- copay amounts may vary at other pharmacies due to pharmacy/plan contracts, or as the patient moves through the different stages of their insurance plan.

## 2023-12-26 NOTE — Progress Notes (Signed)
   12/26/2023 Name: Brian Forbes MRN: [id number] DOB: 09-Apr-1955  Subjective  Chief Complaint  Patient presents with   Medication Access    Care Team: Primary Care Provider: Dineen Rollene MATSU, FNP  Reason for visit: ?  Brian Forbes is a 68 y.o. male who presents today for a telephone visit with the pharmacist due to medication access concerns regarding their prescribed inhalers. ?   Medication Access: ?  Reports that all medications are not affordable.  Current prescriptions: wixela and symbicort  cost are too high  Prescription drug coverage: YES Payor: [employer] / Plan: [employer] - [place] / Product Type: *No Product type* / SABRA [place]  (HMO), Medicare Rx; CMS [phone] Plan details (2025): https://www.devoted.com/plan-documents/benefit-and-coverage-details/H9700-217 403 2364/ Summary of Benefit: $590 deductible for Tier 3-5 Tier 1 (preferred generic) $0 Tier 2 (non-preferred generic) $5 Tier 3 (preferred brand) 25% coinsurance   Tier 2 inhalers: Albuterol  sulfate (generic of Proair /Ventolin /Proventil  HFA) Wixela inhub  Symbicort  generics are Tier 3 Advair generics are Tier 2. Previously reported good efficacy/symptom reduction with Wixela but had stopped due to cost.   Current Patient Assistance: None Asthma options for consideration if needed: AZ&Me: 300% FPL SABA-ICS     Airspura (albuterol /budesonide )  Patient lives in a household of 2 with his wife. Income includes SSI + part-time wages.  Patient estimates income exceeds that for LIS, though estimates income is <300% FPL.   Medicare LIS Eligible: No - income limit exceeded per patient report  2025 Poverty Guidelines  Family Size  200% 250%  300%  400%  500%   1  $31,300 $39,125  $46,950  $62,600 $78,250  2  $42,300 $52,875 $63,450  $84,600 $105,750  3  $53,300 $66,625 $79,950 $106,600 $133,250  4  64,300 $80,375 $96,450 $128,600 $160,750  Programs BI Cares Inhalers BI Cares  AZ&Me BMS GSK NovoNordisk [employer] MedicareD: [employer] cholesterol grant   Assessment and Plan:   1. Medication Access Wixela = Tier 2, should be $5/month which patient states would be affordable for him.  Test claim requested from [facility] pharmacy team to confirm copay of Wixela inhaler - if test claim $5, will msg PCP for refill in place of Symbicort   LIS screening: Does not quality based on reported income  PAP: Would likely qualify, though asthma medication options are limited. Reasonable alternative = Airspura through AZ&Me (albuterol /budesonide ) Last PFT 2018 with ongoing smoking thereafter for several years. Has recently quit. Reasonable to repeat PFT to rule out/screen for possible COPD/progression of lung disease over the past 7 years.  Future Appointments  Date Time Provider Department Center  12/29/2023 10:00 AM Dineen Rollene MATSU, FNP [facility] [address]  01/30/2024  7:30 AM [facility] LAB [facility] [address]  07/16/2024 10:10 AM [facility] ANNUAL WELLNESS VISIT [facility] [address] FABIENE Geronimo, PharmD Clinical Pharmacist [facility] [phone]

## 2023-12-28 ENCOUNTER — Ambulatory Visit: Admitting: Family

## 2023-12-29 ENCOUNTER — Telehealth: Payer: Self-pay | Admitting: Family

## 2023-12-29 ENCOUNTER — Encounter: Admitting: Family

## 2023-12-29 DIAGNOSIS — R053 Chronic cough: Secondary | ICD-10-CM

## 2023-12-29 MED ORDER — FLUTICASONE-SALMETEROL 100-50 MCG/ACT IN AEPB: 1.0000 | INHALATION_SPRAY | Freq: Two times a day (BID) | RESPIRATORY_TRACT | 3 refills | Status: AC

## 2023-12-29 NOTE — Telephone Encounter (Signed)
 Spoke to pt  Cough is 'much better' . Denies fever, SOB, cp Very 'little bit of wheezing.' He is taking zpak, prednisone   He did pick up nebulizer ; and using 1-2 times per day.   He is not taking symbicort .    He is compliant with 81 mg of aspirin daily   [facility] acute bacterial sinusitis 12/24/2023; given xopenex neb in clinic; prescribed prednisone , azithromycin, Promethazine DM. Last seen 12/14/2023 chronic cough.  Prescribed nebulizer machine.  Started Prozac  20 mg daily Pharmacy, medication access consult 11/18/ 2025.  Test claim for Wixela H/o smoking  Plan Declines pulmonology consult Start wixela Sch f/u in 6 weeks

## 2023-12-30 NOTE — Progress Notes (Signed)
 This encounter was created in error - please disregard.

## 2024-01-16 ENCOUNTER — Other Ambulatory Visit: Payer: Self-pay | Admitting: Family

## 2024-01-19 ENCOUNTER — Other Ambulatory Visit: Payer: Self-pay | Admitting: Family

## 2024-01-30 ENCOUNTER — Other Ambulatory Visit

## 2024-02-02 ENCOUNTER — Other Ambulatory Visit: Payer: Self-pay | Admitting: Family

## 2024-02-02 DIAGNOSIS — R053 Chronic cough: Secondary | ICD-10-CM

## 2024-02-28 ENCOUNTER — Telehealth: Payer: Self-pay

## 2024-02-28 ENCOUNTER — Other Ambulatory Visit (HOSPITAL_COMMUNITY): Payer: Self-pay

## 2024-02-28 NOTE — Telephone Encounter (Signed)
 Pharmacy Patient Advocate Encounter   Received notification from Physician's Office that prior authorization for Albuterol  HFA inhaler is required/requested.   Insurance verification completed.   The patient is insured through [employer].   Per test claim: Refill too soon. PA is not needed at this time. Medication was filled ?SABRA Next eligible fill date is 03/06/2024.

## 2024-07-16 ENCOUNTER — Ambulatory Visit
